# Patient Record
Sex: Male | Born: 1965 | Race: White | Hispanic: No | Marital: Single | Smoking: Former smoker
Health system: Southern US, Community
[De-identification: ages and names within clinical notes are randomized; demographics above are authoritative.]

## PROBLEM LIST (undated history)

## (undated) DIAGNOSIS — Z21 Asymptomatic human immunodeficiency virus [HIV] infection status: Secondary | ICD-10-CM

## (undated) DIAGNOSIS — F329 Major depressive disorder, single episode, unspecified: Secondary | ICD-10-CM

## (undated) DIAGNOSIS — N50819 Testicular pain, unspecified: Secondary | ICD-10-CM

## (undated) DIAGNOSIS — I509 Heart failure, unspecified: Secondary | ICD-10-CM

## (undated) DIAGNOSIS — F319 Bipolar disorder, unspecified: Secondary | ICD-10-CM

## (undated) DIAGNOSIS — F32A Depression, unspecified: Secondary | ICD-10-CM

## (undated) DIAGNOSIS — F419 Anxiety disorder, unspecified: Secondary | ICD-10-CM

## (undated) DIAGNOSIS — R197 Diarrhea, unspecified: Secondary | ICD-10-CM

## (undated) DIAGNOSIS — K219 Gastro-esophageal reflux disease without esophagitis: Secondary | ICD-10-CM

## (undated) DIAGNOSIS — B20 Human immunodeficiency virus [HIV] disease: Secondary | ICD-10-CM

## (undated) HISTORY — PX: CHOLECYSTECTOMY: SHX55

## (undated) HISTORY — PX: REPAIR / SUTURE TESTICULAR INJURY: SUR1151

## (undated) HISTORY — PX: TONSILLECTOMY: SUR1361

## (undated) HISTORY — PX: SURGERY SCROTAL / TESTICULAR: SUR1316

## (undated) HISTORY — PX: OTHER SURGICAL HISTORY: SHX169

## (undated) HISTORY — PX: SHOULDER SURGERY: SHX246

---

## 2009-12-20 DIAGNOSIS — Z21 Asymptomatic human immunodeficiency virus [HIV] infection status: Secondary | ICD-10-CM | POA: Insufficient documentation

## 2009-12-20 DIAGNOSIS — Z8619 Personal history of other infectious and parasitic diseases: Secondary | ICD-10-CM

## 2011-04-30 DIAGNOSIS — I219 Acute myocardial infarction, unspecified: Secondary | ICD-10-CM | POA: Insufficient documentation

## 2012-02-11 DIAGNOSIS — E663 Overweight: Secondary | ICD-10-CM | POA: Insufficient documentation

## 2012-06-11 DIAGNOSIS — G4700 Insomnia, unspecified: Secondary | ICD-10-CM | POA: Insufficient documentation

## 2012-06-11 DIAGNOSIS — F5104 Psychophysiologic insomnia: Secondary | ICD-10-CM | POA: Insufficient documentation

## 2013-02-18 DIAGNOSIS — N509 Disorder of male genital organs, unspecified: Secondary | ICD-10-CM | POA: Insufficient documentation

## 2013-08-25 DIAGNOSIS — N489 Disorder of penis, unspecified: Secondary | ICD-10-CM | POA: Insufficient documentation

## 2014-03-31 DIAGNOSIS — N50819 Testicular pain, unspecified: Secondary | ICD-10-CM | POA: Insufficient documentation

## 2014-04-08 DIAGNOSIS — L4050 Arthropathic psoriasis, unspecified: Secondary | ICD-10-CM | POA: Insufficient documentation

## 2014-04-08 DIAGNOSIS — H6980 Other specified disorders of Eustachian tube, unspecified ear: Secondary | ICD-10-CM | POA: Insufficient documentation

## 2014-04-08 DIAGNOSIS — Z79899 Other long term (current) drug therapy: Secondary | ICD-10-CM | POA: Insufficient documentation

## 2014-04-08 DIAGNOSIS — H9192 Unspecified hearing loss, left ear: Secondary | ICD-10-CM | POA: Insufficient documentation

## 2014-04-08 DIAGNOSIS — B20 Human immunodeficiency virus [HIV] disease: Secondary | ICD-10-CM | POA: Insufficient documentation

## 2014-04-26 DIAGNOSIS — G8929 Other chronic pain: Secondary | ICD-10-CM | POA: Insufficient documentation

## 2014-06-11 DIAGNOSIS — F4310 Post-traumatic stress disorder, unspecified: Secondary | ICD-10-CM | POA: Insufficient documentation

## 2014-07-29 DIAGNOSIS — Z87891 Personal history of nicotine dependence: Secondary | ICD-10-CM | POA: Insufficient documentation

## 2014-07-29 DIAGNOSIS — L409 Psoriasis, unspecified: Secondary | ICD-10-CM | POA: Insufficient documentation

## 2014-08-04 ENCOUNTER — Ambulatory Visit: Payer: Self-pay | Admitting: Rheumatology

## 2014-08-09 DIAGNOSIS — M7540 Impingement syndrome of unspecified shoulder: Secondary | ICD-10-CM | POA: Insufficient documentation

## 2014-09-03 ENCOUNTER — Ambulatory Visit: Payer: Self-pay | Admitting: Unknown Physician Specialty

## 2014-09-09 DIAGNOSIS — M7520 Bicipital tendinitis, unspecified shoulder: Secondary | ICD-10-CM | POA: Insufficient documentation

## 2014-10-14 DIAGNOSIS — M25569 Pain in unspecified knee: Secondary | ICD-10-CM | POA: Insufficient documentation

## 2014-11-15 ENCOUNTER — Emergency Department: Payer: Medicare Other

## 2014-11-15 ENCOUNTER — Encounter: Payer: Self-pay | Admitting: Emergency Medicine

## 2014-11-15 ENCOUNTER — Emergency Department
Admission: EM | Admit: 2014-11-15 | Discharge: 2014-11-15 | Disposition: A | Discharge status: Home / Self Care | Payer: Medicare Other | Attending: Emergency Medicine | Admitting: Emergency Medicine

## 2014-11-15 DIAGNOSIS — Z87891 Personal history of nicotine dependence: Secondary | ICD-10-CM | POA: Diagnosis not present

## 2014-11-15 DIAGNOSIS — G8929 Other chronic pain: Secondary | ICD-10-CM | POA: Insufficient documentation

## 2014-11-15 DIAGNOSIS — N508 Other specified disorders of male genital organs: Secondary | ICD-10-CM | POA: Diagnosis present

## 2014-11-15 DIAGNOSIS — N50819 Testicular pain, unspecified: Secondary | ICD-10-CM

## 2014-11-15 HISTORY — DX: Human immunodeficiency virus (HIV) disease: B20

## 2014-11-15 HISTORY — DX: Depression, unspecified: F32.A

## 2014-11-15 HISTORY — DX: Major depressive disorder, single episode, unspecified: F32.9

## 2014-11-15 HISTORY — DX: Anxiety disorder, unspecified: F41.9

## 2014-11-15 HISTORY — DX: Asymptomatic human immunodeficiency virus (hiv) infection status: Z21

## 2014-11-15 HISTORY — DX: Bipolar disorder, unspecified: F31.9

## 2014-11-15 LAB — URINALYSIS COMPLETE WITH MICROSCOPIC (ARMC ONLY)
BACTERIA UA: NONE SEEN
Bilirubin Urine: NEGATIVE
GLUCOSE, UA: NEGATIVE mg/dL
Hgb urine dipstick: NEGATIVE
KETONES UR: NEGATIVE mg/dL
LEUKOCYTES UA: NEGATIVE
NITRITE: NEGATIVE
Protein, ur: 30 mg/dL — AB
SPECIFIC GRAVITY, URINE: 1.023 (ref 1.005–1.030)
Squamous Epithelial / LPF: NONE SEEN
pH: 6 (ref 5.0–8.0)

## 2014-11-15 MED ORDER — OXYCODONE-ACETAMINOPHEN 5-325 MG PO TABS
1.0000 | ORAL_TABLET | Freq: Once | ORAL | Status: AC
  Administered 2014-11-15: 1 via ORAL

## 2014-11-15 MED ORDER — OXYCODONE-ACETAMINOPHEN 5-325 MG PO TABS: 1.0000 | ORAL_TABLET | ORAL | Status: AC | PRN

## 2014-11-15 MED ORDER — MORPHINE SULFATE 2 MG/ML IJ SOLN
INTRAMUSCULAR | Status: AC
  Administered 2014-11-15: 4 mg via INTRAVENOUS
  Filled 2014-11-15: qty 2

## 2014-11-15 MED ORDER — ONDANSETRON HCL 4 MG/2ML IJ SOLN
4.0000 mg | Freq: Once | INTRAMUSCULAR | Status: AC
  Administered 2014-11-15: 4 mg via INTRAVENOUS

## 2014-11-15 MED ORDER — ONDANSETRON HCL 4 MG/2ML IJ SOLN
INTRAMUSCULAR | Status: AC
  Administered 2014-11-15: 4 mg via INTRAVENOUS
  Filled 2014-11-15: qty 2

## 2014-11-15 MED ORDER — MORPHINE SULFATE 4 MG/ML IJ SOLN: 4.0000 mg | Freq: Once | INTRAMUSCULAR | Status: DC

## 2014-11-15 MED ORDER — KETOROLAC TROMETHAMINE 30 MG/ML IJ SOLN
INTRAMUSCULAR | Status: AC
  Filled 2014-11-15: qty 1

## 2014-11-15 MED ORDER — KETOROLAC TROMETHAMINE 30 MG/ML IJ SOLN
30.0000 mg | Freq: Once | INTRAMUSCULAR | Status: AC
  Administered 2014-11-15: 30 mg via INTRAVENOUS

## 2014-11-15 MED ORDER — OXYCODONE-ACETAMINOPHEN 5-325 MG PO TABS
ORAL_TABLET | ORAL | Status: AC
  Filled 2014-11-15: qty 1

## 2014-11-15 NOTE — Discharge Instructions (Signed)
Follow with Dr. Edwyna ShellHart or Dr. Apolinar JunesBrandon or with use a [facility] for further [facility] and ongoing care. Continue current medications. He may take some Percocet if needed for acute pain. Return to the emergency department if you have other urgent concerns.  Pain of Unknown Etiology (Pain Without a Known Cause) You have come to your caregiver because of pain. Pain can occur in any part of the body. Often there is not a definite cause. If your laboratory (blood or urine) work was normal and X-rays or other studies were normal, your caregiver may treat you without knowing the cause of the pain. An example of this is the headache. Most headaches are diagnosed by taking a history. This means your caregiver asks you questions about your headaches. Your caregiver determines a treatment based on your answers. Usually testing done for headaches is normal. Often testing is not done unless there is no response to medications. Regardless of where your pain is located today, you can be given medications to make you comfortable. If no physical cause of pain can be found, most cases of pain will gradually leave as suddenly as they came.  If you have a painful condition and no reason can be found for the pain, it is important that you follow up with your caregiver. If the pain becomes worse or does not go away, it may be necessary to repeat tests and look further for a possible cause.  Only take over-the-counter or prescription medicines for pain, discomfort, or fever as directed by your caregiver.  For the protection of your privacy, test results cannot be given over the phone. Make sure you receive the results of your test. Ask how these results are to be obtained if you have not been informed. It is your responsibility to obtain your test results.  You may continue all activities unless the activities cause more pain. When the pain lessens, it is important to gradually resume normal activities. Resume  activities by beginning slowly and gradually increasing the intensity and duration of the activities or exercise. During periods of severe pain, bed rest may be helpful. Lie or sit in any position that is comfortable.  Ice used for acute (sudden) conditions may be effective. Use a large plastic bag filled with ice and wrapped in a towel. This may provide pain relief.  See your caregiver for continued problems. Your caregiver can help or refer you for exercises or physical therapy if necessary. If you were given medications for your condition, do not drive, operate machinery or power tools, or sign legal documents for 24 hours. Do not drink alcohol, take sleeping pills, or take other medications that may interfere with treatment. See your caregiver immediately if you have pain that is becoming worse and not relieved by medications. Document Released: 03/06/2001 Document Revised: 04/01/2013 Document Reviewed: 06/11/2005 [facility] Patient Information 2015 [place], [place]. This information is not intended to replace advice given to you by your health care provider. Make sure you discuss any questions you have with your health care provider.

## 2014-11-15 NOTE — ED Notes (Signed)
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 [facility]. States does not know reason for pain.

## 2014-11-15 NOTE — ED Provider Notes (Signed)
[facility] Emergency Department Provider Note  ____________________________________________  Time seen: 1810  I have reviewed the triage vital signs and the nursing notes.   HISTORY  Chief Complaint Testicle Pain  right-sided    HPI Dennis Thompson is a 49 y.o. male who has had a problem with right testicular pain for approximately 3 years. He has seen different urologists, primarily at [facility]. He has not been able to get much relief. For a while Toradol by mouth and Viagra were helpful. Those medications no longer work well for him. Today the pain got acutely worse. He does report some nausea and vomiting with the pain. No diarrhea. The pain is in the right testicle and extends a little bit up into the inguinal canal. He has no abdominal pain itself. The pain is severe today.  He has had multiple various therapies in the past including nerve blocks and spinal injections which did not help much.  His primary physician is admitted in areas he currently does not have urologist.   Past Medical History  Diagnosis Date  . Bipolar 1 disorder   . HIV (human immunodeficiency virus infection)   . Depression   . Anxiety     There are no active problems to display for this patient.   Past Surgical History  Procedure Laterality Date  . Shoulder surgery Right     No current outpatient prescriptions on file.  Allergies Sulfa antibiotics  No family history on file.  Social History History  Substance Use Topics  . Smoking status: Former Games developer  . Smokeless tobacco: Not on file  . Alcohol Use: No    Review of Systems  Constitutional: Negative for fever. ENT: Negative for sore throat. Cardiovascular: Negative for chest pain. Respiratory: Negative for shortness of breath. Gastrointestinal: Negative for abdominal pain, vomiting and diarrhea. Genitourinary: Positive for chronic testicular pain. See history of present illness Musculoskeletal:  Negative for back pain. Skin: Negative for rash. Neurological: Negative for headaches Psychiatric: History of bipolar disorder. No acute issues currently.  10-point ROS otherwise negative.  ____________________________________________   PHYSICAL EXAM:  VITAL SIGNS: ED Triage Vitals  Enc Vitals Group     BP 11/15/14 1506 137/79 mmHg     Pulse Rate 11/15/14 1506 50     Resp 11/15/14 1506 18     Temp 11/15/14 1506 98.2 F (36.8 C)     Temp Source 11/15/14 1506 Oral     SpO2 11/15/14 1506 100 %     Weight 11/15/14 1506 177 lb (80.287 kg)     Height 11/15/14 1506  (1.676 m)     Head Cir --      Peak Flow --      Pain Score 11/15/14 1508 9     Pain Loc --      Pain Edu? --      Excl. in GC? --     Constitutional: Alert and oriented. Patient appears uncomfortable.Marland Kitchen ENT   Head: Normocephalic and atraumatic. Cardiovascular: Normal rate, regular rhythm. Respiratory: Normal respiratory effort without tachypnea. Breath sounds are clear and equal bilaterally. No wheezes/rales/rhonchi. Gastrointestinal: Soft and nontender. No distention.  Back: No muscle spasm, no tenderness, no CVA tenderness. Musculoskeletal: Nontender with normal range of motion in all extremities.  No noted edema. Neurologic:  Normal speech and language. No gross focal neurologic deficits are appreciated.  Skin:  Skin is warm, dry. No rash noted. Psychiatric: Mood and affect are normal. Speech and behavior are normal.  Genital:  Normal male genitalia. There is some tenderness to the right testicle. ____________________________________________    LABS (pertinent positives/negatives)  UA:  Urinalysis is negative. No white blood cells no red blood cells. ____________________________________________    RADIOLOGY  Testicular ultrasound  IMPRESSION: Small left hydrocele with multiple left epididymal cysts. No abnormalities on the right.Some of the images showing epididymis head cysts are mislabeled  "right" but I verified with Bridgette HabermannAshleigh Desena, the performing technologist, that all of these images apply to the left side, while the right epididymis is normal.  _________________________________________________   INITIAL IMPRESSION / ASSESSMENT AND PLAN / ED COURSE  Acute on chronic pain with unknown cause. This patient has pretty good insight to his situation. He is pleasant and communicative. He understands that with this chronic and calm. A problem we will not be solving her tonight. We had a long discussion about follow-up. We will refer him to Dr. Edwyna ShellHart and Dr. Apolinar JunesBrandon for further evaluation. I've also encouraged [facility] to [facility] as he has seen them before.  ____________________________________________   FINAL CLINICAL IMPRESSION(S) / ED DIAGNOSES  Final diagnoses:  Testicular pain      Darien Ramusavid W Chrys Landgrebe, MD 11/15/14 [phone]

## 2014-11-23 ENCOUNTER — Emergency Department: Payer: Medicare Other

## 2014-11-23 ENCOUNTER — Emergency Department
Admission: EM | Admit: 2014-11-23 | Discharge: 2014-11-23 | Disposition: A | Discharge status: Home / Self Care | Payer: Medicare Other | Attending: Emergency Medicine | Admitting: Emergency Medicine

## 2014-11-23 DIAGNOSIS — Z79899 Other long term (current) drug therapy: Secondary | ICD-10-CM | POA: Diagnosis not present

## 2014-11-23 DIAGNOSIS — R55 Syncope and collapse: Secondary | ICD-10-CM | POA: Diagnosis present

## 2014-11-23 DIAGNOSIS — Z87891 Personal history of nicotine dependence: Secondary | ICD-10-CM | POA: Insufficient documentation

## 2014-11-23 DIAGNOSIS — Y998 Other external cause status: Secondary | ICD-10-CM | POA: Diagnosis not present

## 2014-11-23 DIAGNOSIS — R197 Diarrhea, unspecified: Secondary | ICD-10-CM | POA: Insufficient documentation

## 2014-11-23 DIAGNOSIS — S01111A Laceration without foreign body of right eyelid and periocular area, initial encounter: Secondary | ICD-10-CM | POA: Insufficient documentation

## 2014-11-23 DIAGNOSIS — Y9389 Activity, other specified: Secondary | ICD-10-CM | POA: Insufficient documentation

## 2014-11-23 DIAGNOSIS — Y9289 Other specified places as the place of occurrence of the external cause: Secondary | ICD-10-CM | POA: Diagnosis not present

## 2014-11-23 DIAGNOSIS — S0083XA Contusion of other part of head, initial encounter: Secondary | ICD-10-CM

## 2014-11-23 DIAGNOSIS — W228XXA Striking against or struck by other objects, initial encounter: Secondary | ICD-10-CM | POA: Diagnosis not present

## 2014-11-23 LAB — CBC
HCT: 52 % (ref 40.0–52.0)
Hemoglobin: 17.7 g/dL (ref 13.0–18.0)
MCH: 33.6 pg (ref 26.0–34.0)
MCHC: 34.1 g/dL (ref 32.0–36.0)
MCV: 98.6 fL (ref 80.0–100.0)
PLATELETS: 265 10*3/uL (ref 150–440)
RBC: 5.28 MIL/uL (ref 4.40–5.90)
RDW: 13.3 % (ref 11.5–14.5)
WBC: 12.6 10*3/uL — ABNORMAL HIGH (ref 3.8–10.6)

## 2014-11-23 LAB — BASIC METABOLIC PANEL
Anion gap: 11 (ref 5–15)
BUN: 11 mg/dL (ref 6–20)
CALCIUM: 10.5 mg/dL — AB (ref 8.9–10.3)
CO2: 22 mmol/L (ref 22–32)
CREATININE: 1.28 mg/dL — AB (ref 0.61–1.24)
Chloride: 104 mmol/L (ref 101–111)
GFR calc Af Amer: >60 mL/min (ref >60)
Glucose, Bld: 97 mg/dL (ref 65–99)
Potassium: 3.4 mmol/L — ABNORMAL LOW (ref 3.5–5.1)
SODIUM: 137 mmol/L (ref 135–145)

## 2014-11-23 LAB — C DIFFICILE QUICK SCREEN W PCR REFLEX
C DIFFICILE (CDIFF) TOXIN: NEGATIVE
C DIFFICLE (CDIFF) ANTIGEN: NEGATIVE
C Diff interpretation: NEGATIVE

## 2014-11-23 MED ORDER — ONDANSETRON HCL 4 MG/2ML IJ SOLN
4.0000 mg | Freq: Once | INTRAMUSCULAR | Status: AC
  Administered 2014-11-23: 4 mg via INTRAVENOUS

## 2014-11-23 MED ORDER — SODIUM CHLORIDE 0.9 % IV BOLUS (SEPSIS)
1000.0000 mL | Freq: Once | INTRAVENOUS | Status: AC
  Administered 2014-11-23: 1000 mL via INTRAVENOUS

## 2014-11-23 MED ORDER — KETOROLAC TROMETHAMINE 30 MG/ML IJ SOLN
30.0000 mg | Freq: Once | INTRAMUSCULAR | Status: AC
  Administered 2014-11-23: 30 mg via INTRAVENOUS

## 2014-11-23 MED ORDER — KETOROLAC TROMETHAMINE 30 MG/ML IJ SOLN
INTRAMUSCULAR | Status: AC
  Administered 2014-11-23: 30 mg via INTRAVENOUS
  Filled 2014-11-23: qty 1

## 2014-11-23 MED ORDER — OXYCODONE HCL 5 MG PO TABS
ORAL_TABLET | ORAL | Status: AC
  Administered 2014-11-23: 10 mg via ORAL
  Filled 2014-11-23: qty 2

## 2014-11-23 MED ORDER — OXYCODONE HCL 5 MG PO TABS
10.0000 mg | ORAL_TABLET | Freq: Once | ORAL | Status: AC
Start: 2014-11-23 — End: 2014-11-23
  Administered 2014-11-23: 10 mg via ORAL

## 2014-11-23 MED ORDER — ONDANSETRON HCL 4 MG/2ML IJ SOLN
INTRAMUSCULAR | Status: AC
  Filled 2014-11-23: qty 2

## 2014-11-23 MED ORDER — METOCLOPRAMIDE HCL 5 MG/ML IJ SOLN
5.0000 mg | Freq: Once | INTRAMUSCULAR | Status: AC
  Administered 2014-11-23: 5 mg via INTRAVENOUS

## 2014-11-23 MED ORDER — SODIUM CHLORIDE 0.9 % IV SOLN
Freq: Once | INTRAVENOUS | Status: AC
  Administered 2014-11-23: 21:00:00 via INTRAVENOUS

## 2014-11-23 MED ORDER — METOCLOPRAMIDE HCL 5 MG/ML IJ SOLN
INTRAMUSCULAR | Status: AC
  Administered 2014-11-23: 5 mg via INTRAVENOUS
  Filled 2014-11-23: qty 2

## 2014-11-23 MED ORDER — METRONIDAZOLE 500 MG PO TABS: 500.0000 mg | ORAL_TABLET | Freq: Two times a day (BID) | ORAL | Status: AC

## 2014-11-23 MED ORDER — METOCLOPRAMIDE HCL 5 MG PO TABS: 5.0000 mg | ORAL_TABLET | Freq: Three times a day (TID) | ORAL | Status: AC

## 2014-11-23 NOTE — ED Provider Notes (Signed)
[facility] Emergency Department Provider Note  ____________________________________________  Time seen: 1805  I have reviewed the triage vital signs and the nursing notes.   HISTORY  Chief Complaint Loss of Consciousness and Diarrhea     HPI Dennis Thompson is a 49 y.o. male known to me from prior visits. He has a long history of chronic testicular pain. He is also HIV positive on antiretrovirals. Today he comes to the emergency Department due to nausea vomiting and diarrhea. He has had an extensive amount of watery diarrhea over the past 3 days.   Last night, while on the toilet, he began to feel weak and faint and had a syncopal episode. He woke up on the floor with a contusion to his right brow. He is not sure how long he was unconscious for.  He presents today with ongoing nausea and vomiting. He reports 4-5 bowel movements earlier today, all were watery. He has not been on any antibiotics recently, he does not have a history of Clostridium difficile.   Past Medical History  Diagnosis Date  . Bipolar 1 disorder   . HIV (human immunodeficiency virus infection)   . Depression   . Anxiety     There are no active problems to display for this patient.   Past Surgical History  Procedure Laterality Date  . Shoulder surgery Right   . Cholecystectomy    . Tonsillectomy    . Repair / suture testicular injury Right     Current Outpatient Rx  Name  Route  Sig  Dispense  Refill  . Adalimumab 40 MG/0.8ML PSKT   Subcutaneous   Inject 40 mg into the skin every 14 (fourteen) days.         Marland Kitchen. buPROPion (WELLBUTRIN XL) 150 MG 24 hr tablet   Oral   Take 150 mg by mouth daily.         . clonazePAM (KLONOPIN) 0.5 MG tablet   Oral   Take 0.5 mg by mouth 3 (three) times daily as needed for anxiety.         Marland Kitchen. emtricitabine-tenofovir (TRUVADA) 200-300 MG per tablet   Oral   Take 1 tablet by mouth daily.         Marland Kitchen. esomeprazole (NEXIUM) 40 MG capsule    Oral   Take 40 mg by mouth daily at 12 noon.         . folic acid (FOLVITE) 1 MG tablet   Oral   Take 1 mg by mouth daily.         Marland Kitchen. Ketorolac Tromethamine (TORADOL ORAL PO)   Oral   Take 25 mg by mouth daily. Pt states takes 25 mg of toradol for testicular pain         . lithium 300 MG tablet   Oral   Take 900 mg by mouth 2 (two) times daily.         . methotrexate (RHEUMATREX) 2.5 MG tablet   Oral   Take 2.5 mg by mouth once a week. Caution:Chemotherapy. Protect from light.         . raltegravir (ISENTRESS) 400 MG tablet   Oral   Take 400 mg by mouth 2 (two) times daily.         . sildenafil (REVATIO) 20 MG tablet   Oral   Take 10 mg by mouth daily as needed (used for testicle pain according to pt).         . tamsulosin (FLOMAX) 0.4  MG CAPS capsule   Oral   Take 0.4 mg by mouth.         . valACYclovir (VALTREX) 500 MG tablet   Oral   Take 500 mg by mouth 2 (two) times daily.         . metoCLOPramide (REGLAN) 5 MG tablet   Oral   Take 1 tablet (5 mg total) by mouth 3 (three) times daily.   15 tablet   1   . metroNIDAZOLE (FLAGYL) 500 MG tablet   Oral   Take 1 tablet (500 mg total) by mouth 2 (two) times daily.   14 tablet   0   . oxyCODONE-acetaminophen (ROXICET) 5-325 MG per tablet   Oral   Take 1 tablet by mouth every 4 (four) hours as needed for severe pain.   12 tablet   0     Allergies Sulfa antibiotics  No family history on file.  Social History History  Substance Use Topics  . Smoking status: Former Games developer  . Smokeless tobacco: Never Used  . Alcohol Use: No    Review of Systems  Constitutional: Negative for fever. ENT:  Contusion right brow, see history of present illness. Cardiovascular: Negative for chest pain. Notable for syncope, see history of present illness Respiratory: Negative for shortness of breath. Gastrointestinal: Nausea vomiting diarrhea. See history of present illness. Genitourinary: Negative for  dysuria. Musculoskeletal: Negative for back pain. Skin: Negative for rash. Neurological: Positive for headache, likely from contusion. See history of present illness Immunologic: HIV, on antiretrovirals.  10-point ROS otherwise negative.  ____________________________________________   PHYSICAL EXAM:  VITAL SIGNS: ED Triage Vitals  Enc Vitals Group     BP 11/23/14 1603 133/92 mmHg     Pulse Rate 11/23/14 1603 56     Resp 11/23/14 1603 16     Temp 11/23/14 1603 97.5 F (36.4 C)     Temp Source 11/23/14 1603 Oral     SpO2 11/23/14 1603 99 %     Weight 11/23/14 1603 171 lb 3.2 oz (77.656 kg)     Height 11/23/14 1603  (1.676 m)     Head Cir --      Peak Flow --      Pain Score 11/23/14 1604 7     Pain Loc --      Pain Edu? --      Excl. in GC? --     Constitutional:  Alert and oriented. Patient appears frustrated by his wait to be seen in the emergency department and by his current location and a whole bed.  ENT   Head: There is a contusion to the right brow with a small laceration. There is minimal swelling there. The remainder of the exam of his head is within normal expectations.   Nose: No congestion/rhinnorhea.   Mouth/Throat: Mucous membranes are moist. Cardiovascular: Normal rate, regular rhythm. Respiratory: Normal respiratory effort without tachypnea. Breath sounds are clear and equal bilaterally. No wheezes/rales/rhonchi. Gastrointestinal: Soft with mild tenderness. Back: No muscle spasm, no tenderness, no CVA tenderness. Musculoskeletal: Nontender with normal range of motion in all extremities.  No noted edema. Neurologic:  Normal speech and language. No gross focal neurologic deficits are appreciated.  Skin:  Skin is warm, dry. No rash noted. Psychiatric: Other than the patient's frustration with the emergency department wait and location, his mood and affect are normal. Given our family Artie with each other, he calm down quickly and is appreciative  of the care. Speech and behavior  are normal.  ____________________________________________    LABS (pertinent positives/negatives)  Blood cell count 12.6 thousand and hemoglobin of 17.7 Potassium 3.4U and 11 creatinine 1.28 sodium 137.  ____________________________________________  ED ECG REPORT I, Uriyah Raska W, the attending physician, personally viewed and interpreted this ECG.   Date: 11/23/2014  EKG Time: 1631  Rate: 52  Rhythm:Sinus bradycardia  Axis: Normal  Intervals:  Normal  ST&T Change:  None  RADIOLOGY  CT head:  IMPRESSION: Mild ethmoid air cell disease bilaterally. No intracranial mass, hemorrhage, or extra-axial fluid collection. No focal gray -white compartment lesion/acute appearing infarct  ____________________________________________   INITIAL IMPRESSION / ASSESSMENT AND PLAN / ED COURSE  I'm concerned for the patient with his watery diarrhea that he may have C. difficile or some other form of infection. We will start him on metronidazole. We have obtained a small sample for C. difficile testing. He felt much better after some IV fluids and Reglan IV. He was treated with Toradol for his headache.   ----------------------------------------- 10:25 PM on 11/23/2014 -----------------------------------------  After 2 L of IV fluid the patient has urinated once. He is tolerating food and fluids by mouth. He continues to complain of a general headache as well as some abdominal discomfort, but he is not having any current nausea. He has an appointment tomorrow with his infectious disease doctor at [facility]. I will prescribe metronidazole and Reglan.  ____________________________________________   FINAL CLINICAL IMPRESSION(S) / ED DIAGNOSES  Final diagnoses:  Diarrhea  Vasovagal syncope  Contusion of face, initial encounter      Darien Ramus, MD 11/23/14 2226

## 2014-11-23 NOTE — ED Notes (Signed)
Pt c/o having watery diarrhea since Sunday with nausea..states last night around 10pm while on the toilet having a loose stool he states he passed out hitting head, none bleeding lac to the right brow.the patient  C/o HA with nausea since..Marland Kitchen

## 2014-11-23 NOTE — Discharge Instructions (Signed)
It is not clear what is causing her diarrhea currently. We are testing for C. difficile. Take metronidazole. Use Reglan if needed for nausea. Follow-up with your regular doctor. Return to the emergency department if you have any other urgent concerns.  Diarrhea Diarrhea is frequent loose and watery bowel movements. It can cause you to feel weak and dehydrated. Dehydration can cause you to become tired and thirsty, have a dry mouth, and have decreased urination that often is dark yellow. Diarrhea is a sign of another problem, most often an infection that will not last long. In most cases, diarrhea typically lasts 2-3 days. However, it can last longer if it is a sign of something more serious. It is important to treat your diarrhea as directed by your caregiver to lessen or prevent future episodes of diarrhea. CAUSES  Some common causes include:  Gastrointestinal infections caused by viruses, bacteria, or parasites.  Food poisoning or food allergies.  Certain medicines, such as antibiotics, chemotherapy, and laxatives.  Artificial sweeteners and fructose.  Digestive disorders. HOME CARE INSTRUCTIONS  Ensure adequate fluid intake (hydration): Have 1 cup (8 oz) of fluid for each diarrhea episode. Avoid fluids that contain simple sugars or sports drinks, fruit juices, whole milk products, and sodas. Your urine should be clear or pale yellow if you are drinking enough fluids. Hydrate with an oral rehydration solution that you can purchase at pharmacies, retail stores, and online. You can prepare an oral rehydration solution at home by mixing the following ingredients together:   - tsp table salt.   tsp baking soda.   tsp salt substitute containing potassium chloride.  1  tablespoons sugar.  1 L (34 oz) of water.  Certain foods and beverages may increase the speed at which food moves through the gastrointestinal (GI) tract. These foods and beverages should be avoided and  include:  Caffeinated and alcoholic beverages.  High-fiber foods, such as raw fruits and vegetables, nuts, seeds, and whole grain breads and cereals.  Foods and beverages sweetened with sugar alcohols, such as xylitol, sorbitol, and mannitol.  Some foods may be well tolerated and may help thicken stool including:  Starchy foods, such as rice, toast, pasta, low-sugar cereal, oatmeal, grits, baked potatoes, crackers, and bagels.  Bananas.  Applesauce.  Add probiotic-rich foods to help increase healthy bacteria in the GI tract, such as yogurt and fermented milk products.  Wash your hands well after each diarrhea episode.  Only take over-the-counter or prescription medicines as directed by your caregiver.  Take a warm bath to relieve any burning or pain from frequent diarrhea episodes. SEEK IMMEDIATE MEDICAL CARE IF:   You are unable to keep fluids down.  You have persistent vomiting.  You have blood in your stool, or your stools are black and tarry.  You do not urinate in 6-8 hours, or there is only a small amount of very dark urine.  You have abdominal pain that increases or localizes.  You have weakness, dizziness, confusion, or light-headedness.  You have a severe headache.  Your diarrhea gets worse or does not get better.  You have a fever or persistent symptoms for more than 2-3 days.  You have a fever and your symptoms suddenly get worse. MAKE SURE YOU:   Understand these instructions.  Will watch your condition.  Will get help right away if you are not doing well or get worse. Document Released: 06/01/2002 Document Revised: 10/26/2013 Document Reviewed: 02/17/2012 [facility] Patient Information 2015 [place], [place]. This information is not  intended to replace advice given to you by your health care provider. Make sure you discuss any questions you have with your health care provider. ° °

## 2014-11-23 NOTE — ED Notes (Signed)
MD at bedside. 

## 2014-12-06 ENCOUNTER — Telehealth: Payer: Self-pay | Admitting: Urology

## 2014-12-06 NOTE — Telephone Encounter (Addendum)
Pt called on 12/06/14 to confirm appt on 12/10/14  to find out that it was scheduled with Dr. Erlene Quan instead of Dr. Elnoria Howard. Pt was upset that the provider had been changed w/o his knowledge. That the ER doctor who referred him had wanted him to see Dr. Elnoria Howard and that he has been "fighting" with his current doctors office to have his medical records released to our office in time for the appointment on 12/10/14. Pt wants to speak with Lattie Haw when he comes in on 7.8.16 @ 4:30 pm. Best contact # [phone]  12/06/14 MAF

## 2014-12-10 ENCOUNTER — Ambulatory Visit: Payer: Self-pay | Admitting: Urology

## 2014-12-23 ENCOUNTER — Encounter: Payer: Self-pay | Admitting: Emergency Medicine

## 2014-12-23 ENCOUNTER — Emergency Department
Admission: EM | Admit: 2014-12-23 | Discharge: 2014-12-24 | Disposition: A | Discharge status: Home / Self Care | Payer: Medicare Other | Attending: Emergency Medicine | Admitting: Emergency Medicine

## 2014-12-23 DIAGNOSIS — Z79899 Other long term (current) drug therapy: Secondary | ICD-10-CM | POA: Diagnosis not present

## 2014-12-23 DIAGNOSIS — R197 Diarrhea, unspecified: Secondary | ICD-10-CM | POA: Diagnosis present

## 2014-12-23 DIAGNOSIS — R1084 Generalized abdominal pain: Secondary | ICD-10-CM | POA: Diagnosis not present

## 2014-12-23 DIAGNOSIS — Z87891 Personal history of nicotine dependence: Secondary | ICD-10-CM | POA: Diagnosis not present

## 2014-12-23 HISTORY — DX: Testicular pain, unspecified: N50.819

## 2014-12-23 HISTORY — DX: Diarrhea, unspecified: R19.7

## 2014-12-23 HISTORY — DX: Heart failure, unspecified: I50.9

## 2014-12-23 LAB — CBC WITH DIFFERENTIAL/PLATELET
Basophils Absolute: 0 10*3/uL (ref 0–0.1)
Basophils Relative: 1 %
EOS ABS: 0.5 10*3/uL (ref 0–0.7)
EOS PCT: 5 %
HEMATOCRIT: 45.7 % (ref 40.0–52.0)
Hemoglobin: 15.4 g/dL (ref 13.0–18.0)
LYMPHS PCT: 23 %
Lymphs Abs: 2.1 10*3/uL (ref 1.0–3.6)
MCH: 33.7 pg (ref 26.0–34.0)
MCHC: 33.8 g/dL (ref 32.0–36.0)
MCV: 99.9 fL (ref 80.0–100.0)
MONOS PCT: 10 %
Monocytes Absolute: 1 10*3/uL (ref 0.2–1.0)
Neutro Abs: 5.8 10*3/uL (ref 1.4–6.5)
Neutrophils Relative %: 61 %
PLATELETS: 231 10*3/uL (ref 150–440)
RBC: 4.57 MIL/uL (ref 4.40–5.90)
RDW: 13.2 % (ref 11.5–14.5)
WBC: 9.4 10*3/uL (ref 3.8–10.6)

## 2014-12-23 LAB — COMPREHENSIVE METABOLIC PANEL
ALT: 22 U/L (ref 17–63)
ANION GAP: 14 (ref 5–15)
AST: 36 U/L (ref 15–41)
Albumin: 4.2 g/dL (ref 3.5–5.0)
Alkaline Phosphatase: 116 U/L (ref 38–126)
BUN: 15 mg/dL (ref 6–20)
CALCIUM: 10.3 mg/dL (ref 8.9–10.3)
CHLORIDE: 104 mmol/L (ref 101–111)
CO2: 20 mmol/L — ABNORMAL LOW (ref 22–32)
Creatinine, Ser: 1.46 mg/dL — ABNORMAL HIGH (ref 0.61–1.24)
GFR calc Af Amer: >60 mL/min (ref >60)
GFR calc non Af Amer: 56 mL/min — ABNORMAL LOW (ref >60)
GLUCOSE: 125 mg/dL — AB (ref 65–99)
Potassium: 2.9 mmol/L — CL (ref 3.5–5.1)
SODIUM: 138 mmol/L (ref 135–145)
TOTAL PROTEIN: 7 g/dL (ref 6.5–8.1)
Total Bilirubin: 1.1 mg/dL (ref 0.3–1.2)

## 2014-12-23 LAB — URINALYSIS COMPLETE WITH MICROSCOPIC (ARMC ONLY)
BILIRUBIN URINE: NEGATIVE
Glucose, UA: NEGATIVE mg/dL
HGB URINE DIPSTICK: NEGATIVE
Ketones, ur: NEGATIVE mg/dL
LEUKOCYTES UA: NEGATIVE
Nitrite: NEGATIVE
PROTEIN: NEGATIVE mg/dL
SPECIFIC GRAVITY, URINE: 1.017 (ref 1.005–1.030)
pH: 6 (ref 5.0–8.0)

## 2014-12-23 LAB — LIPASE, BLOOD: LIPASE: 58 U/L — AB (ref 22–51)

## 2014-12-23 MED ORDER — POTASSIUM CHLORIDE 20 MEQ/15ML (10%) PO SOLN
40.0000 meq | Freq: Once | ORAL | Status: AC
  Administered 2014-12-24: 40 meq via ORAL

## 2014-12-23 MED ORDER — POTASSIUM CHLORIDE 20 MEQ PO PACK
PACK | ORAL | Status: AC
  Administered 2014-12-24: 40 meq
  Filled 2014-12-23: qty 2

## 2014-12-23 MED ORDER — SODIUM CHLORIDE 0.9 % IV BOLUS (SEPSIS)
1000.0000 mL | Freq: Once | INTRAVENOUS | Status: AC
  Administered 2014-12-24: 1000 mL via INTRAVENOUS

## 2014-12-23 NOTE — ED Notes (Signed)
Discussed need for urine sample with patient. Patient denies need to void at this time. Encouraged patient to notify nurse when able to provide sample. Patient verbalized understanding.   

## 2014-12-23 NOTE — ED Provider Notes (Signed)
[facility] Emergency Department Provider Note  ____________________________________________  Time seen: 11:20 PM  I have reviewed the triage vital signs and the nursing notes.   HISTORY  Chief Complaint Emesis and Diarrhea      HPI Dennis Thompson is a 49 y.o. male presents with generalized abdominal discomfort and nausea and persistent diarrhea times approximately 3 weeks. Patient admits to copious amounts of malodorous diarrhea with mucus daily 3 weeks. Patient admits to visiting [place] prior to onset of symptoms and drank water from a well that is was approximated to a creek. Patient also admits to chills no fever.    Past Medical History  Diagnosis Date  . Bipolar 1 disorder   . HIV (human immunodeficiency virus infection)   . Depression   . Anxiety   . Testicular pain   . Diarrhea     There are no active problems to display for this patient.   Past Surgical History  Procedure Laterality Date  . Shoulder surgery Right   . Cholecystectomy    . Tonsillectomy    . Repair / suture testicular injury Right     Current Outpatient Rx  Name  Route  Sig  Dispense  Refill  . Adalimumab 40 MG/0.8ML PSKT   Subcutaneous   Inject 40 mg into the skin every 14 (fourteen) days.         Marland Kitchen buPROPion (WELLBUTRIN XL) 150 MG 24 hr tablet   Oral   Take 150 mg by mouth daily.         . clonazePAM (KLONOPIN) 0.5 MG tablet   Oral   Take 0.5 mg by mouth 3 (three) times daily as needed for anxiety.         Marland Kitchen emtricitabine-tenofovir (TRUVADA) 200-300 MG per tablet   Oral   Take 1 tablet by mouth daily.         Marland Kitchen esomeprazole (NEXIUM) 40 MG capsule   Oral   Take 40 mg by mouth daily at 12 noon.         . folic acid (FOLVITE) 1 MG tablet   Oral   Take 1 mg by mouth daily.         Marland Kitchen Ketorolac Tromethamine (TORADOL ORAL PO)   Oral   Take 25 mg by mouth daily. Pt states takes 25 mg of toradol for testicular pain         . lithium 300 MG  tablet   Oral   Take 900 mg by mouth 2 (two) times daily.         . methotrexate (RHEUMATREX) 2.5 MG tablet   Oral   Take 2.5 mg by mouth once a week. Caution:Chemotherapy. Protect from light.         . metoCLOPramide (REGLAN) 5 MG tablet   Oral   Take 1 tablet (5 mg total) by mouth 3 (three) times daily.   15 tablet   1   . metroNIDAZOLE (FLAGYL) 500 MG tablet   Oral   Take 1 tablet (500 mg total) by mouth 2 (two) times daily.   14 tablet   0   . oxyCODONE-acetaminophen (ROXICET) 5-325 MG per tablet   Oral   Take 1 tablet by mouth every 4 (four) hours as needed for severe pain.   12 tablet   0   . raltegravir (ISENTRESS) 400 MG tablet   Oral   Take 400 mg by mouth 2 (two) times daily.         Marland Kitchen  sildenafil (REVATIO) 20 MG tablet   Oral   Take 10 mg by mouth daily as needed (used for testicle pain according to pt).         . tamsulosin (FLOMAX) 0.4 MG CAPS capsule   Oral   Take 0.4 mg by mouth.         . valACYclovir (VALTREX) 500 MG tablet   Oral   Take 500 mg by mouth 2 (two) times daily.           Allergies Sulfa antibiotics  No family history on file.  Social History History  Substance Use Topics  . Smoking status: Former Games developermoker  . Smokeless tobacco: Never Used  . Alcohol Use: No    Review of Systems  Constitutional: Negative for fever. Eyes: Negative for visual changes. ENT: Negative for sore throat. Cardiovascular: Negative for chest pain. Respiratory: Negative for shortness of breath. Gastrointestinal: Positive for abdominal pain, negative for vomiting and diarrhea. Genitourinary: Negative for dysuria. Musculoskeletal: Negative for back pain. Skin: Negative for rash. Neurological: Negative for headaches, focal weakness or numbness.   10-point ROS otherwise negative.  ____________________________________________   PHYSICAL EXAM:  VITAL SIGNS: ED Triage Vitals  Enc Vitals Group     BP 12/23/14 2114 138/78 mmHg     Pulse  Rate 12/23/14 2114 67     Resp 12/23/14 2114 16     Temp 12/23/14 2114 98.2 F (36.8 C)     Temp Source 12/23/14 2114 Oral     SpO2 12/23/14 2114 98 %     Weight 12/23/14 2114 164 lb 3.2 oz (74.481 kg)     Height 12/23/14 2114 5\' 6"  (1.676 m)     Head Cir --      Peak Flow --      Pain Score 12/23/14 2115 7     Pain Loc --      Pain Edu? --      Excl. in GC? --     Constitutional: Alert and oriented. Well appearing and in no distress. Eyes: Conjunctivae are normal. PERRL. Normal extraocular movements. ENT   Head: Normocephalic and atraumatic.   Nose: No congestion/rhinnorhea.   Mouth/Throat: Mucous membranes are moist.   Neck: No stridor. Cardiovascular: Normal rate, regular rhythm. Normal and symmetric distal pulses are present in all extremities. No murmurs, rubs, or gallops. Respiratory: Normal respiratory effort without tachypnea nor retractions. Breath sounds are clear and equal bilaterally. No wheezes/rales/rhonchi. Gastrointestinal: Generalized tenderness to palpation. No distention. There is no CVA tenderness. Genitourinary: deferred Musculoskeletal: Nontender with normal range of motion in all extremities. No joint effusions.  No lower extremity tenderness nor edema. Neurologic:  Normal speech and language. No gross focal neurologic deficits are appreciated. Speech is normal.  Skin:  Skin is warm, dry and intact. No rash noted. Psychiatric: Mood and affect are normal. Speech and behavior are normal. Patient exhibits appropriate insight and judgment.  ____________________________________________    LABS (pertinent positives/negatives)  Labs Reviewed  COMPREHENSIVE METABOLIC PANEL - Abnormal; Notable for the following:    Potassium 2.9 (*)    CO2 20 (*)    Glucose, Bld 125 (*)    Creatinine, Ser 1.46 (*)    GFR calc non Af Amer 56 (*)    All other components within normal limits  LIPASE, BLOOD - Abnormal; Notable for the following:    Lipase 58 (*)     All other components within normal limits  URINALYSIS COMPLETEWITH MICROSCOPIC ([facility] ONLY) - Abnormal; Notable for the  following:    Color, Urine YELLOW (*)    APPearance CLEAR (*)    Bacteria, UA RARE (*)    Squamous Epithelial / LPF 0-5 (*)    All other components within normal limits  STOOL CULTURE  GIARDIA/CRYPTOSPORIDIUM EIA  CBC WITH DIFFERENTIAL/PLATELET      RADIOLOGY  Ct abdomen/pelvis revealed:  IMPRESSION: Fluid-filled colon consistent with the given clinical history of diarrhea.  Small testicular hydroceles.  No other focal abnormality is noted.   INITIAL IMPRESSION / ASSESSMENT AND PLAN / ED COURSE  Pertinent labs & imaging results that were available during my care of the patient were reviewed by me and considered in my medical decision making (see chart for details).  History and physical exam concerning for infectious diarrhea. Patient prescribed ciprofloxacin and Flagyl while we await Giardia cryptosporidia and stool culture.  ____________________________________________   FINAL CLINICAL IMPRESSION(S) / ED DIAGNOSES  Final diagnoses:  Diarrhea      Darci Current, MD 12/28/14 [phone]

## 2014-12-23 NOTE — ED Notes (Signed)
N/V/D all day. Also experiencing testicular pain. Headache.

## 2014-12-24 ENCOUNTER — Emergency Department: Payer: Medicare Other

## 2014-12-24 MED ORDER — IOHEXOL 240 MG/ML SOLN
25.0000 mL | Freq: Once | INTRAMUSCULAR | Status: AC | PRN
  Administered 2014-12-24: 25 mL via ORAL

## 2014-12-24 MED ORDER — METRONIDAZOLE 500 MG PO TABS: 500.0000 mg | ORAL_TABLET | Freq: Once | ORAL | Status: AC

## 2014-12-24 MED ORDER — METRONIDAZOLE 500 MG PO TABS
ORAL_TABLET | ORAL | Status: AC
  Administered 2014-12-24: 500 mg via ORAL
  Filled 2014-12-24: qty 1

## 2014-12-24 MED ORDER — IOHEXOL 300 MG/ML  SOLN
100.0000 mL | Freq: Once | INTRAMUSCULAR | Status: AC | PRN
  Administered 2014-12-24: 100 mL via INTRAVENOUS

## 2014-12-24 MED ORDER — LORAZEPAM 2 MG/ML IJ SOLN
INTRAMUSCULAR | Status: AC
  Administered 2014-12-24: 1 mg via INTRAVENOUS
  Filled 2014-12-24: qty 1

## 2014-12-24 MED ORDER — LOPERAMIDE HCL 2 MG PO CAPS
4.0000 mg | ORAL_CAPSULE | ORAL | Status: DC | PRN
  Administered 2014-12-24: 4 mg via ORAL

## 2014-12-24 MED ORDER — LOPERAMIDE HCL 2 MG PO CAPS
ORAL_CAPSULE | ORAL | Status: AC
  Administered 2014-12-24: 4 mg via ORAL
  Filled 2014-12-24: qty 2

## 2014-12-24 MED ORDER — CIPROFLOXACIN HCL 500 MG PO TABS
ORAL_TABLET | ORAL | Status: AC
  Administered 2014-12-24: 500 mg via ORAL
  Filled 2014-12-24: qty 1

## 2014-12-24 MED ORDER — METRONIDAZOLE 500 MG PO TABS
500.0000 mg | ORAL_TABLET | Freq: Once | ORAL | Status: AC
  Administered 2014-12-24: 500 mg via ORAL

## 2014-12-24 MED ORDER — LORAZEPAM 2 MG/ML IJ SOLN
1.0000 mg | Freq: Once | INTRAMUSCULAR | Status: AC
  Administered 2014-12-24: 1 mg via INTRAVENOUS

## 2014-12-24 MED ORDER — CIPROFLOXACIN HCL 500 MG PO TABS: 500.0000 mg | ORAL_TABLET | Freq: Once | ORAL | Status: AC

## 2014-12-24 MED ORDER — CIPROFLOXACIN HCL 500 MG PO TABS
500.0000 mg | ORAL_TABLET | Freq: Once | ORAL | Status: AC
  Administered 2014-12-24: 500 mg via ORAL

## 2014-12-24 NOTE — Discharge Instructions (Signed)
Chronic Diarrhea  Diarrhea is frequent loose and watery bowel movements. It can cause you to feel weak and dehydrated. Dehydration can cause you to become tired and thirsty and to have a dry mouth, decreased urination, and dark yellow urine. Diarrhea is a sign of another problem, most often an infection that will not last long. In most cases, diarrhea lasts 2-3 days. Diarrhea that lasts longer than 4 weeks is called long-lasting (chronic) diarrhea. It is important to treat your diarrhea as directed by your health care provider to lessen or prevent future episodes of diarrhea.   CAUSES   There are many causes of chronic diarrhea. The following are some possible causes:   · Gastrointestinal infections caused by viruses, bacteria, or parasites.    · Food poisoning or food allergies.    · Certain medicines, such as antibiotics, chemotherapy, and laxatives.    · Artificial sweeteners and fructose.    · Digestive disorders, such as celiac disease and inflammatory bowel diseases.    · Irritable bowel syndrome.  · Some disorders of the pancreas.  · Disorders of the thyroid.  · Reduced blood flow to the intestines.  · Cancer.  Sometimes the cause of chronic diarrhea is unknown.  RISK FACTORS  · Having a severely weakened immune system, such as from HIV or AIDS.    · Taking certain types of cancer-fighting drugs (such as with chemotherapy) or other medicines.    · Having had a recent organ transplant.    · Having a portion of the stomach or small bowel removed.    · Traveling to countries where food and water supplies are often contaminated.    SYMPTOMS   In addition to frequent, loose stools, diarrhea may cause:   · Cramping.    · Abdominal pain.    · Nausea.    · Fever.  · Fatigue.  · Urgent need to use the bathroom.  · Loss of bowel control.  DIAGNOSIS   Your health care provider must take a careful history and perform a physical exam. Tests given are based on your symptoms and history. Tests may include:   · Blood or  stool tests. Three or more stool samples may be examined. Stool cultures may be used to test for bacteria or parasites.    · X-rays.    · A procedure in which a thin tube is inserted into the mouth or rectum (endoscopy). This allows the health care provider to look inside the intestine.    TREATMENT   · Treatment is aimed at correcting the cause of the diarrhea when possible.  · Diarrhea caused by an infection can often be treated with antibiotic medicines.  · Diarrhea not caused by an infection may require you to take long-term medicine or have surgery. Specific treatment should be discussed with your health care provider.  · If the cause cannot be determined, treatment aims to relieve symptoms and prevent dehydration. Serious health problems can occur if you do not maintain proper fluid levels. Treatment may include:  ¨ Taking an oral rehydration solution (ORS).  ¨ Not drinking beverages that contain caffeine (such as tea, coffee, and soft drinks).  ¨ Not drinking alcohol.  ¨ Maintaining well-balanced nutrition to help you recover faster.  HOME CARE INSTRUCTIONS   · Drink enough fluids to keep urine clear or pale yellow. Drink 1 cup (8 oz) of fluid for each diarrhea episode. Avoid fluids that contain simple sugars, fruit juices, whole milk products, and sodas. Hydrate with an ORS. You may purchase the ORS or prepare it at home by mixing the   following ingredients together:  ¨  - tsp (1.7-3  mL) table salt.  ¨ ¾ tsp (3 ¾ mL) baking soda.  ¨  tsp (1.7 mL) salt substitute containing potassium chloride.  ¨ 1 tbsp (20 mL) sugar.  ¨ 4.2 c (1 L) of water.    · Certain foods and beverages may increase the speed at which food moves through the gastrointestinal (GI) tract. These foods and beverages should be avoided. They include:  ¨ Caffeinated and alcoholic beverages.  ¨ High-fiber foods, such as raw fruits and vegetables, nuts, seeds, and whole grain breads and cereals.  ¨ Foods and beverages sweetened with sugar  alcohols, such as xylitol, sorbitol, and mannitol.    · Some foods may be well tolerated and may help thicken stool. These include:  ¨ Starchy foods, such as rice, toast, pasta, low-sugar cereal, oatmeal, grits, baked potatoes, crackers, and bagels.  ¨ Bananas.  ¨ Applesauce.  · Add probiotic-rich foods to help increase healthy bacteria in the GI tract. These include yogurt and fermented milk products.  · Wash your hands well after each diarrhea episode.  · Only take over-the-counter or prescription medicines as directed by your health care provider.  · Take a warm bath to relieve any burning or pain from frequent diarrhea episodes.  SEEK MEDICAL CARE IF:   · You are not urinating as often.  · Your urine is a dark color.  · You become very tired or dizzy.  · You have severe pain in the abdomen or rectum.  · Your have blood or pus in your stools.  · Your stools look black and tarry.  SEEK IMMEDIATE MEDICAL CARE IF:   · You are unable to keep fluids down.  · You have persistent vomiting.  · You have blood in your stool.  · Your stools are black and tarry.  · You do not urinate in 6-8 hours, or there is only a small amount of very dark urine.  · You have abdominal pain that increases or localizes.  · You have weakness, dizziness, confusion, or lightheadedness.  · You have a severe headache.  · Your diarrhea gets worse or does not get better.  · You have a fever or persistent symptoms for more than 2-3 days.  · You have a fever and your symptoms suddenly get worse.  MAKE SURE YOU:   · Understand these instructions.  · Will watch your condition.  · Will get help right away if you are not doing well or get worse.  Document Released: 09/01/2003 Document Revised: 06/16/2013 Document Reviewed: 12/04/2012  Exit[employer]® Patient Information ©2015 [employer]. This information is not intended to replace advice given to you by your health care provider. Make sure you discuss any questions you have with your health care  provider.

## 2014-12-24 NOTE — ED Notes (Signed)
Patient with no complaints at this time. Respirations even and unlabored. Skin warm/dry. Discharge instructions reviewed with patient at this time. Patient given opportunity to voice concerns/ask questions. IV removed per policy and band-aid applied to site. Patient discharged at this time and left Emergency Department with steady gait.  

## 2014-12-25 ENCOUNTER — Telehealth: Payer: Self-pay | Admitting: Emergency Medicine

## 2014-12-25 NOTE — ED Notes (Signed)
Pt called back and informed that a note would be passed a long to our nurse in charge of call backs who would be able to follow up with him on Monday.  Pt encouraged to come back if he starts to feel worse and we would be glad to take care of him.  Pt reports that he continues to have the same issues, but was thankful for the information

## 2014-12-26 LAB — GIARDIA/CRYPTOSPORIDIUM EIA
Cryptosporidium EIA: NEGATIVE
Giardia Ag, Stl: NEGATIVE

## 2014-12-27 LAB — STOOL CULTURE

## 2014-12-28 NOTE — ED Notes (Signed)
Called patient and gave results for stool tests and advised to follow with pcp.

## 2014-12-31 ENCOUNTER — Ambulatory Visit (INDEPENDENT_AMBULATORY_CARE_PROVIDER_SITE_OTHER): Payer: Self-pay | Admitting: Urology

## 2014-12-31 ENCOUNTER — Encounter: Payer: Self-pay | Admitting: *Deleted

## 2014-12-31 ENCOUNTER — Encounter: Payer: Self-pay | Admitting: Urology

## 2014-12-31 VITALS — BP 122/78 | HR 51 | Ht 66.0 in | Wt 178.8 lb

## 2014-12-31 DIAGNOSIS — N529 Male erectile dysfunction, unspecified: Secondary | ICD-10-CM | POA: Insufficient documentation

## 2014-12-31 DIAGNOSIS — F319 Bipolar disorder, unspecified: Secondary | ICD-10-CM | POA: Insufficient documentation

## 2014-12-31 DIAGNOSIS — K219 Gastro-esophageal reflux disease without esophagitis: Secondary | ICD-10-CM | POA: Insufficient documentation

## 2014-12-31 DIAGNOSIS — N50819 Testicular pain, unspecified: Secondary | ICD-10-CM

## 2014-12-31 DIAGNOSIS — E785 Hyperlipidemia, unspecified: Secondary | ICD-10-CM | POA: Insufficient documentation

## 2014-12-31 DIAGNOSIS — N509 Disorder of male genital organs, unspecified: Secondary | ICD-10-CM

## 2014-12-31 DIAGNOSIS — N411 Chronic prostatitis: Secondary | ICD-10-CM

## 2014-12-31 DIAGNOSIS — A609 Anogenital herpesviral infection, unspecified: Secondary | ICD-10-CM | POA: Insufficient documentation

## 2014-12-31 DIAGNOSIS — F410 Panic disorder [episodic paroxysmal anxiety] without agoraphobia: Secondary | ICD-10-CM | POA: Insufficient documentation

## 2014-12-31 MED ORDER — KETOROLAC TROMETHAMINE 10 MG PO TABS: 25.0000 mg | ORAL_TABLET | Freq: Four times a day (QID) | ORAL | Status: AC | PRN

## 2014-12-31 NOTE — Progress Notes (Signed)
12/31/2014 2:50 PM   Levora DredgeCarl F Rix Aug 10, 1965 [id number]  Referring provider: Cyndie MullGloria Patricia Santayana, DO [address] [place], [place] [id number]  Chief Complaint  Patient presents with  . Groin Swelling    er refferal Dr. Carollee MassedKaminski    HPI: Patient presents with a complex history. He seen 6 urologist to home and he was agreeable with. [facility]. This was Dr. Hanley HaysPaul Kulick. This evaluation consisted of nerve blocks epidural spinal block 0 block nerve denervation spermatic cord block did not take care of his orchialgia. He has occasional severe testicular pain that brings him to his knees. He is never been told he has prostatitis and never has been treated with low-dose chronic antibiotics the prostate is very painful to touch it is small in not nodular it is symmetric penis is normal as a secondary complaint of difficulty holding his erection I put back off to be part of his prostatitis problem not to his testicular problem. He is 49 years also holding erection can be difficult and his age. He does take Viagra did not ask for refill same. I refilled his ketorolac he had 25 mg by mouth when necessary severe pain and I've placed him on trimethoprim 100 mg daily he will follow-up with her gastroenterologist for his diarrhea and nausea. He may have a colitis with his pelvic pain. He has no bladder symptoms since his all pelvic floor pain     PMH: Past Medical History  Diagnosis Date  . Bipolar 1 disorder   . HIV (human immunodeficiency virus infection)   . Depression   . Anxiety   . Testicular pain   . Diarrhea   . CHF (congestive heart failure)     Surgical History: Past Surgical History  Procedure Laterality Date  . Shoulder surgery Right   . Cholecystectomy    . Tonsillectomy    . Repair / suture testicular injury Right   . Nerve blocks      several  . Surgery scrotal / testicular    .  Denervevation      Home Medications:    Medication List       This list is accurate as of: 12/31/14  2:50 PM.  Always use your most recent med list.               acetaminophen 500 MG tablet  Commonly known as:  TYLENOL  Take by mouth.     Adalimumab 40 MG/0.8ML Pskt  Inject 40 mg into the skin every 14 (fourteen) days.     buPROPion 150 MG 24 hr tablet  Commonly known as:  WELLBUTRIN XL  Take 150 mg by mouth daily.     ciprofloxacin 500 MG tablet  Commonly known as:  CIPRO  Take 1 tablet (500 mg total) by mouth once.     clonazePAM 0.5 MG tablet  Commonly known as:  KLONOPIN  Take 0.5 mg by mouth 3 (three) times daily as needed for anxiety.     emtricitabine-tenofovir 200-300 MG per tablet  Commonly known as:  TRUVADA  Take 1 tablet by mouth daily.     esomeprazole 40 MG capsule  Commonly known as:  NEXIUM  Take 40 mg by mouth daily at 12 noon.     folic acid 1 MG tablet  Commonly known as:  FOLVITE  Take 1 mg by mouth daily.     gabapentin  300 MG capsule  Commonly known as:  NEURONTIN  Take by mouth.     HYDROmorphone 2 MG tablet  Commonly known as:  DILAUDID  Take by mouth.     ibuprofen 200 MG tablet  Commonly known as:  ADVIL,MOTRIN  Take by mouth.     lithium 300 MG tablet  Take 900 mg by mouth 2 (two) times daily.     methotrexate 2.5 MG tablet  Commonly known as:  RHEUMATREX  Take 2.5 mg by mouth once a week. Caution:Chemotherapy. Protect from light.     metoCLOPramide 5 MG tablet  Commonly known as:  REGLAN  Take 1 tablet (5 mg total) by mouth 3 (three) times daily.     metroNIDAZOLE 500 MG tablet  Commonly known as:  FLAGYL  Take 1 tablet (500 mg total) by mouth 2 (two) times daily.     metroNIDAZOLE 500 MG tablet  Commonly known as:  FLAGYL  Take 1 tablet (500 mg total) by mouth once.     mirtazapine 15 MG tablet  Commonly known as:  REMERON  Sometimes     ondansetron 4 MG disintegrating tablet  Commonly known as:  ZOFRAN-ODT    Take by mouth.     ondansetron 4 MG tablet  Commonly known as:  ZOFRAN  Take by mouth.     oxyCODONE-acetaminophen 5-325 MG per tablet  Commonly known as:  ROXICET  Take 1 tablet by mouth every 4 (four) hours as needed for severe pain.     pantoprazole 40 MG tablet  Commonly known as:  PROTONIX  Take by mouth.     raltegravir 400 MG tablet  Commonly known as:  ISENTRESS  Take 400 mg by mouth 2 (two) times daily.     sildenafil 20 MG tablet  Commonly known as:  REVATIO  Take 10 mg by mouth daily as needed (used for testicle pain according to pt).     tamsulosin 0.4 MG Caps capsule  Commonly known as:  FLOMAX  Take 0.4 mg by mouth.     TORADOL ORAL PO  Take 25 mg by mouth daily. Pt states takes 25 mg of toradol for testicular pain     ketorolac 10 MG tablet  Commonly known as:  TORADOL  Take 2.5 tablets (25 mg total) by mouth every 6 (six) hours as needed.     traMADol 50 MG tablet  Commonly known as:  ULTRAM  Take by mouth.     valACYclovir 500 MG tablet  Commonly known as:  VALTREX  Take 500 mg by mouth 2 (two) times daily.        Allergies:  Allergies  Allergen Reactions  . Sulfamethoxazole-Trimethoprim Hives and Nausea And Vomiting  . Sulfa Antibiotics Rash and Hives    Family History: Family History  Problem Relation Age of Onset  . Kidney disease Neg Hx   . Bladder Cancer Neg Hx   . Prostate cancer Neg Hx   . Testicular cancer Neg Hx   . Arthritis Mother   . Heart disease Father     Social History:  reports that he has quit smoking. He has never used smokeless tobacco. He reports that he does not drink alcohol or use illicit drugs.  ROS: Urological Symptom Review  Patient is experiencing the following symptoms  :    Review of Systems UROLOGY Frequent Urination?: Yes Hard to postpone urination?: Yes Burning/pain with urination?: Yes Get up at night to urinate?: Yes Leakage of urine?: No Urine  stream starts and stops?: No Trouble  starting stream?: No Do you have to strain to urinate?: No Blood in urine?: No Urinary tract infection?: No Sexually transmitted disease?: Yes Injury to kidneys or bladder?: No Painful intercourse?: No Weak stream?: No Erection problems?: Yes Penile pain?: Yes Gastrointestinal Nausea?: Yes Vomiting?: Yes Indigestion/heartburn?: Yes Diarrhea?: No Constipation?: No Constitutional Fever: No Night sweats?: No Weight loss?: No Fatigue?: Yes Skin Skin rash/lesions?: No Itching?: No Eyes Blurred vision?: No Double vision?: No Ears/Nose/Throat Sore throat?: No Sinus problems?: No Hematologic/Lymphatic Swollen glands?: No Easy bruising?: No Cardiovascular Leg swelling?: No Chest pain?: No Respiratory Cough?: No Shortness of breath?: No Endocrine Excessive thirst?: No Musculoskeletal Back pain?: No Joint pain?: Yes Neurological Headaches?: Yes Dizziness?: No Psychologic Depression?: Yes Anxiety?: Yes   Physical Exam: BP 122/78 mmHg  Pulse 51  Ht 5\' 6"  (1.676 m)  Wt 178 lb 12.8 oz (81.103 kg)  BMI 28.87 kg/m2  Constitutional:  Alert and oriented, No acute distress. HEENT: [place] AT, moist mucus membranes.  Trachea midline, no masses. Cardiovascular: No clubbing, cyanosis, or edema. Respiratory: Normal respiratory effort, no increased work of breathing. GI: Abdomen is soft, nontender, nondistended, no abdominal masses GU: No CVA tenderness. Soft boggy prostate painful right testicle without epididymitis varicocelectomy or any real reason for pain. There is minimal hydrocele around the testicle testicle pain was reproduced on rectal exam Skin: No rashes, bruises or suspicious lesions. Lymph: No cervical or inguinal adenopathy. Neurologic: Grossly intact, no focal deficits, moving all 4 extremities. Psychiatric: Normal mood and affect.  Laboratory Data: Lab Results  Component Value Date   WBC 9.4 12/23/2014   HGB 15.4 12/23/2014   HCT 45.7 12/23/2014   MCV  99.9 12/23/2014   PLT 231 12/23/2014    Lab Results  Component Value Date   CREATININE 1.46* 12/23/2014    No results found for: PSA  No results found for: TESTOSTERONE  No results found for: HGBA1C  Urinalysis    Component Value Date/Time   COLORURINE YELLOW* 12/23/2014 2114   APPEARANCEUR CLEAR* 12/23/2014 2114   LABSPEC 1.017 12/23/2014 2114   PHURINE 6.0 12/23/2014 2114   GLUCOSEU NEGATIVE 12/23/2014 2114   HGBUR NEGATIVE 12/23/2014 2114   BILIRUBINUR NEGATIVE 12/23/2014 2114   KETONESUR NEGATIVE 12/23/2014 2114   PROTEINUR NEGATIVE 12/23/2014 2114   NITRITE NEGATIVE 12/23/2014 2114   LEUKOCYTESUR NEGATIVE 12/23/2014 2114    Pertinent Imaging: Pertinent imaging none Assessment & Plan:  This a patient whose rectal exam and a soft boggy painful prostate reproduced his testicular pain. He has a combination chronic prostatitis and orchialgia probably secondary to chronic prostatitis with some pelvic floor disorder this never been taken care of by pelvic floor rehabilitation. My suggestion to him continue him on his ketorolac by mouth present formation which helps his pain has intermittent testicular pain and long-term low-dose antibiotics. He is allergic to sulfa so I'll put him on trimethoprim or doxycycline for his chronic pain. A dressing is HIV positive and is openly K I would put him on a antibiotic for 2 months and then check his PSA to make sure there is no or prostate. Occasionally can present this way. I recommended pelvic floor rehabilitation at 1 ensure urology by urology pelvic floor physical therapist also recommended acupuncture. He also should see a.m. gastroenterologist for his diarrhea on occasion explosive in nature.  1. Chronic prostatitis - ketorolac (TORADOL) 10 MG tablet; Take 2.5 tablets (25 mg total) by mouth every 6 (six) hours as needed.  Dispense: 60 tablet; Refill: 0  2. Orchalgia    No Follow-up on file.  Lorraine Lax, MD  [facility]  [facility] [address] [place], [place] [address] [phone]

## 2015-01-03 NOTE — Telephone Encounter (Signed)
Spoke with pt in reference to obtaining colonoscopy. Pt stated Dr. Edwyna ShellHart was very adamant about getting colonoscopy results therefore Dr. Edwyna ShellHart should order it. Nurse explained to pt that we are a urology office which is a speciality and Dr. Edwyna ShellHart did not put in his office note that he wanted the colonoscopy ordered, therefore nurse could not order colonoscopy. Pt was very upset stating he has been having testicular pain for the past 3 years, has seen 6 different urologist, and from his understanding Dr. Edwyna ShellHart does not think his pain is urologically related. Nurse reinforced with pt that very well may be the case, but since Dr. Edwyna ShellHart is not here we can only go off of what his office visit note says. Nurse again reinforced to the pt per Dr. Scheryl DarterHart's note a colonoscopy can not be ordered. At this point pt's phone cut off and pt did not answer when nurse attempted to call pt back. Cw, lpn

## 2015-01-03 NOTE — Telephone Encounter (Signed)
Patient is calling and requesting a return call from the nursing staff regarding his appointment with Dr. Edwyna ShellHart.  Dr. Edwyna ShellHart recommended that he speak to his PCP about a referral to another doctor and he is not sure of his next steps.

## 2015-01-11 ENCOUNTER — Other Ambulatory Visit: Payer: Self-pay | Admitting: Physician Assistant

## 2015-01-11 DIAGNOSIS — K529 Noninfective gastroenteritis and colitis, unspecified: Secondary | ICD-10-CM

## 2015-01-11 DIAGNOSIS — R1030 Lower abdominal pain, unspecified: Secondary | ICD-10-CM

## 2015-01-12 ENCOUNTER — Ambulatory Visit: Payer: Medicare Other

## 2015-01-14 ENCOUNTER — Telehealth: Payer: Self-pay

## 2015-01-14 NOTE — Telephone Encounter (Signed)
Pt called with c/o not being able to get an erection since DRE at last office visit. Pt was demanding to speak to Dr. Edwyna Shell. Nurse advised pt Dr. Edwyna Shell is not here today and will not be back for 2 weeks. Pt became very angry and started yelling that is not acceptable. Nurse offered an appt to be made with another provider. Pt declined. Nurse made pt aware he could go to the ER. Pt declined demanding to see Dr. Edwyna Shell. Nurse again reinforced Dr. Edwyna Shell will not be in the office for 2 weeks. At which point pt demanded a referral to another urologist and demanded to be seen today. Nurse made pt aware a referral could be done, but could not promise he would be seen today. Pt started yelling again stating that was not acceptable and he needed to get an erection immediately. Nurse reinforced with pt an appt could be made with another provider or an appt could be made in 2 weeks with Dr. Edwyna Shell. Pt continued to yell and slammed the phone down.

## 2015-02-04 ENCOUNTER — Other Ambulatory Visit: Payer: Self-pay | Admitting: Unknown Physician Specialty

## 2015-02-04 DIAGNOSIS — M7541 Impingement syndrome of right shoulder: Secondary | ICD-10-CM

## 2015-02-04 DIAGNOSIS — M7521 Bicipital tendinitis, right shoulder: Secondary | ICD-10-CM

## 2015-02-10 ENCOUNTER — Ambulatory Visit: Payer: Medicare Other | Admitting: Anesthesiology

## 2015-02-10 ENCOUNTER — Ambulatory Visit: Payer: Medicare Other

## 2015-02-10 ENCOUNTER — Encounter: Payer: Self-pay | Admitting: *Deleted

## 2015-02-10 ENCOUNTER — Encounter
Admission: RE | Disposition: A | Discharge status: Home Health | Payer: Self-pay | Source: Ambulatory Visit | Attending: Unknown Physician Specialty

## 2015-02-10 ENCOUNTER — Ambulatory Visit
Admission: RE | Admit: 2015-02-10 | Discharge: 2015-02-10 | Disposition: A | Discharge status: Home Health | Payer: Medicare Other | Source: Ambulatory Visit | Attending: Unknown Physician Specialty | Admitting: Unknown Physician Specialty

## 2015-02-10 DIAGNOSIS — B20 Human immunodeficiency virus [HIV] disease: Secondary | ICD-10-CM | POA: Insufficient documentation

## 2015-02-10 DIAGNOSIS — Z79899 Other long term (current) drug therapy: Secondary | ICD-10-CM | POA: Insufficient documentation

## 2015-02-10 DIAGNOSIS — Z882 Allergy status to sulfonamides status: Secondary | ICD-10-CM | POA: Insufficient documentation

## 2015-02-10 DIAGNOSIS — Z87891 Personal history of nicotine dependence: Secondary | ICD-10-CM | POA: Diagnosis not present

## 2015-02-10 DIAGNOSIS — K648 Other hemorrhoids: Secondary | ICD-10-CM | POA: Insufficient documentation

## 2015-02-10 DIAGNOSIS — I252 Old myocardial infarction: Secondary | ICD-10-CM | POA: Insufficient documentation

## 2015-02-10 DIAGNOSIS — I509 Heart failure, unspecified: Secondary | ICD-10-CM | POA: Insufficient documentation

## 2015-02-10 DIAGNOSIS — R197 Diarrhea, unspecified: Secondary | ICD-10-CM | POA: Diagnosis present

## 2015-02-10 DIAGNOSIS — K219 Gastro-esophageal reflux disease without esophagitis: Secondary | ICD-10-CM | POA: Diagnosis not present

## 2015-02-10 DIAGNOSIS — E785 Hyperlipidemia, unspecified: Secondary | ICD-10-CM | POA: Diagnosis not present

## 2015-02-10 DIAGNOSIS — K529 Noninfective gastroenteritis and colitis, unspecified: Secondary | ICD-10-CM | POA: Diagnosis not present

## 2015-02-10 DIAGNOSIS — K2950 Unspecified chronic gastritis without bleeding: Secondary | ICD-10-CM | POA: Diagnosis not present

## 2015-02-10 DIAGNOSIS — R599 Enlarged lymph nodes, unspecified: Secondary | ICD-10-CM | POA: Diagnosis not present

## 2015-02-10 HISTORY — DX: Gastro-esophageal reflux disease without esophagitis: K21.9

## 2015-02-10 HISTORY — PX: COLONOSCOPY WITH PROPOFOL: SHX5780

## 2015-02-10 HISTORY — PX: ESOPHAGOGASTRODUODENOSCOPY (EGD) WITH PROPOFOL: SHX5813

## 2015-02-10 SURGERY — COLONOSCOPY WITH PROPOFOL
Anesthesia: General

## 2015-02-10 MED ORDER — GLYCOPYRROLATE 0.2 MG/ML IJ SOLN
INTRAMUSCULAR | Status: DC | PRN
  Administered 2015-02-10: 0.2 mg via INTRAVENOUS

## 2015-02-10 MED ORDER — PROPOFOL 10 MG/ML IV BOLUS
INTRAVENOUS | Status: DC | PRN
  Administered 2015-02-10: 50 mg via INTRAVENOUS

## 2015-02-10 MED ORDER — PROPOFOL INFUSION 10 MG/ML OPTIME
INTRAVENOUS | Status: DC | PRN
  Administered 2015-02-10: 200 ug/kg/min via INTRAVENOUS

## 2015-02-10 MED ORDER — LIDOCAINE HCL (PF) 2 % IJ SOLN
INTRAMUSCULAR | Status: DC | PRN
  Administered 2015-02-10: 50 mg

## 2015-02-10 MED ORDER — SODIUM CHLORIDE 0.9 % IV SOLN: INTRAVENOUS | Status: DC

## 2015-02-10 MED ORDER — FENTANYL CITRATE (PF) 100 MCG/2ML IJ SOLN
INTRAMUSCULAR | Status: DC | PRN
  Administered 2015-02-10: 50 ug via INTRAVENOUS

## 2015-02-10 MED ORDER — SODIUM CHLORIDE 0.9 % IV SOLN
INTRAVENOUS | Status: DC
  Administered 2015-02-10 (×2): via INTRAVENOUS

## 2015-02-10 MED ORDER — MIDAZOLAM HCL 5 MG/5ML IJ SOLN
INTRAMUSCULAR | Status: DC | PRN
  Administered 2015-02-10: 2 mg via INTRAVENOUS

## 2015-02-10 NOTE — Transfer of Care (Signed)
Immediate Anesthesia Transfer of Care Note  Patient: Dennis Thompson  Procedure(s) Performed: Procedure(s): COLONOSCOPY WITH PROPOFOL (N/A) ESOPHAGOGASTRODUODENOSCOPY (EGD) WITH PROPOFOL (N/A)  Patient Location: PACU  Anesthesia Type:General  Level of Consciousness: sedated  Airway & Oxygen Therapy: Patient Spontanous Breathing and Patient connected to nasal cannula oxygen  Post-op Assessment: Post -op Vital signs reviewed and stable  Post vital signs: Reviewed and stable  Last Vitals:  Filed Vitals:   02/10/15 1253  BP: 129/80  Pulse: 50  Temp: 37.3 C  Resp: 24    Complications: No apparent anesthesia complications

## 2015-02-10 NOTE — Op Note (Signed)
[facility] Patient Name: Dennis Thompson Procedure Date: 02/10/2015 1:31 PM MRN: [id number] Account #: [id number] Date of Birth: September 15, 1965 Admit Type: Outpatient Age: 49 Room: [facility] ENDO ROOM 1 Gender: Male Note Status: Finalized Procedure:         Upper GI endoscopy Indications:       Diarrhea Providers:         Scot Jun, MD Referring MD:      No Local Md, MD (Referring MD) Medicines:         Propofol per Anesthesia Complications:     No immediate complications. Procedure:         Pre-Anesthesia Assessment:                    - After reviewing the risks and benefits, the patient was                     deemed in satisfactory condition to undergo the procedure.                    After obtaining informed consent, the endoscope was passed                     under direct vision. Throughout the procedure, the                     patient's blood pressure, pulse, and oxygen saturations                     were monitored continuously. The Olympus GIF-160 endoscope                     (S#. [id number]) was introduced through the mouth, and                     advanced to the second part of duodenum. The upper GI                     endoscopy was accomplished without difficulty. The patient                     tolerated the procedure well. Findings:      The examined esophagus was normal.      The entire examined stomach was normal. Biopsies were taken with a cold       forceps for histology. Biopsies were taken with a cold forceps for       Helicobacter pylori testing.      The examined duodenum was normal. Biopsies were taken with a cold       forceps for histology. Biopsies for histology were taken with a cold       forceps for for evaluation of celiac disease. From both bulb and second       portion Impression:        - Normal esophagus.                    - Normal stomach. Biopsied.                    - Normal examined duodenum.  Biopsied. Recommendation:    - Await pathology results. Proceed with colonoscopy Scot Jun, MD 02/10/2015 1:56:59 PM This report has been signed electronically. Number of Addenda: 0 Note Initiated On: 02/10/2015 1:31 PM      [place]  [facility]

## 2015-02-10 NOTE — Op Note (Signed)
[facility]enterology Patient Name: Dennis Thompson Procedure Date: 02/10/2015 1:30 PM MRN: [id number] Account #: [id number] Date of Birth: Nov 01, 1965 Admit Type: Outpatient Age: 49 Room: [facility] ENDO ROOM 1 Gender: Male Note Status: Finalized Procedure:         Colonoscopy Indications:       Chronic diarrhea, Clinically significant diarrhea of                     unexplained origin Providers:         Scot Jun, MD Referring MD:      No Local Md, MD (Referring MD) Medicines:         Propofol per Anesthesia Complications:     No immediate complications. Procedure:         Pre-Anesthesia Assessment:                    - After reviewing the risks and benefits, the patient was                     deemed in satisfactory condition to undergo the procedure.                    After obtaining informed consent, the colonoscope was                     passed under direct vision. Throughout the procedure, the                     patient's blood pressure, pulse, and oxygen saturations                     were monitored continuously. The Olympus PCF-H180AL                     colonoscope ( S#: [id number] ) was introduced through the                     anus and advanced to the the cecum, identified by                     appendiceal orifice and ileocecal valve. The colonoscopy                     was performed without difficulty. The patient tolerated                     the procedure well. Findings:      Rectal exam showed small prostate without nodules.      The colon (entire examined portion) appeared normal. Biopsies were taken       with a cold forceps for histology. Done from ascending, transverse,       descending, and sigmoid colon.      Internal hemorrhoids were found. The hemorrhoids were small. Thin layer       of liquid stool was seen in many areas. No polyps seen.      The exam was otherwise without abnormality. Impression:        - The entire examined colon  is normal. Biopsied.                    - Internal hemorrhoids.                    - The examination was otherwise normal.  Recommendation:    - Await pathology results. Scot Jun, MD 02/10/2015 2:16:12 PM This report has been signed electronically. Number of Addenda: 0 Note Initiated On: 02/10/2015 1:30 PM Scope Withdrawal Time: 0 hours 7 minutes 37 seconds  Total Procedure Duration: 0 hours 10 minutes 31 seconds       [facility]

## 2015-02-10 NOTE — Anesthesia Preprocedure Evaluation (Signed)
Anesthesia Evaluation  Patient identified by MRN, date of birth, ID band Patient awake    Reviewed: Allergy & Precautions, H&P , NPO status , Patient's Chart, lab work & pertinent test results, reviewed documented beta blocker date and time   Airway Mallampati: II  TM Distance: >3 FB Neck ROM: full    Dental no notable dental hx. (+) Teeth Intact   Pulmonary neg pulmonary ROS, former smoker,  breath sounds clear to auscultation  Pulmonary exam normal       Cardiovascular Exercise Tolerance: Good + Past MI and +CHF Rhythm:regular Rate:Normal     Neuro/Psych PSYCHIATRIC DISORDERS negative neurological ROS  negative psych ROS   GI/Hepatic negative GI ROS, Neg liver ROS, GERD-  ,  Endo/Other  negative endocrine ROS  Renal/GU negative Renal ROS  negative genitourinary   Musculoskeletal   Abdominal   Peds  Hematology negative hematology ROS (+) HIV,   Anesthesia Other Findings   Reproductive/Obstetrics negative OB ROS                             Anesthesia Physical Anesthesia Plan  ASA: III  Anesthesia Plan: General   Post-op Pain Management:    Induction:   Airway Management Planned:   Additional Equipment:   Intra-op Plan:   Post-operative Plan:   Informed Consent: I have reviewed the patients History and Physical, chart, labs and discussed the procedure including the risks, benefits and alternatives for the proposed anesthesia with the patient or authorized representative who has indicated his/her understanding and acceptance.   Dental Advisory Given  Plan Discussed with: CRNA  Anesthesia Plan Comments:         Anesthesia Quick Evaluation

## 2015-02-11 ENCOUNTER — Encounter: Payer: Self-pay | Admitting: Unknown Physician Specialty

## 2015-02-14 ENCOUNTER — Ambulatory Visit
Admission: RE | Admit: 2015-02-14 | Discharge: 2015-02-14 | Disposition: A | Discharge status: Home / Self Care | Payer: Medicare Other | Source: Ambulatory Visit | Attending: Unknown Physician Specialty | Admitting: Unknown Physician Specialty

## 2015-02-14 DIAGNOSIS — M7521 Bicipital tendinitis, right shoulder: Secondary | ICD-10-CM

## 2015-02-14 DIAGNOSIS — M7541 Impingement syndrome of right shoulder: Secondary | ICD-10-CM | POA: Diagnosis not present

## 2015-02-14 NOTE — Anesthesia Postprocedure Evaluation (Signed)
  Anesthesia Post-op Note  Patient: Dennis Thompson  Procedure(s) Performed: Procedure(s): COLONOSCOPY WITH PROPOFOL (N/A) ESOPHAGOGASTRODUODENOSCOPY (EGD) WITH PROPOFOL (N/A)  Anesthesia type:General  Patient location: PACU  Post pain: Pain level controlled  Post assessment: Post-op Vital signs reviewed, Patient's Cardiovascular Status Stable, Respiratory Function Stable, Patent Airway and No signs of Nausea or vomiting  Post vital signs: Reviewed and stable  Last Vitals:  Filed Vitals:   02/10/15 1448  BP: 120/80  Pulse: 54  Temp:   Resp: 16    Level of consciousness: awake, alert  and patient cooperative  Complications: No apparent anesthesia complications

## 2015-02-15 LAB — SURGICAL PATHOLOGY

## 2015-03-04 ENCOUNTER — Ambulatory Visit: Payer: Self-pay | Admitting: Urology

## 2015-03-04 ENCOUNTER — Ambulatory Visit (INDEPENDENT_AMBULATORY_CARE_PROVIDER_SITE_OTHER): Payer: Medicare Other | Admitting: Obstetrics and Gynecology

## 2015-03-04 VITALS — BP 136/69 | HR 50 | Ht 66.0 in | Wt 174.7 lb

## 2015-03-04 DIAGNOSIS — N411 Chronic prostatitis: Secondary | ICD-10-CM

## 2015-03-04 DIAGNOSIS — N400 Enlarged prostate without lower urinary tract symptoms: Secondary | ICD-10-CM | POA: Diagnosis not present

## 2015-03-04 DIAGNOSIS — R102 Pelvic and perineal pain: Secondary | ICD-10-CM

## 2015-03-04 LAB — URINALYSIS, COMPLETE
BILIRUBIN UA: NEGATIVE
GLUCOSE, UA: NEGATIVE
KETONES UA: NEGATIVE
LEUKOCYTES UA: NEGATIVE
NITRITE UA: NEGATIVE
Protein, UA: NEGATIVE
RBC, UA: NEGATIVE
SPEC GRAV UA: 1.01 (ref 1.005–1.030)
Urobilinogen, Ur: 0.2 mg/dL (ref 0.2–1.0)
pH, UA: 7 (ref 5.0–7.5)

## 2015-03-04 LAB — MICROSCOPIC EXAMINATION
Bacteria, UA: NONE SEEN
Epithelial Cells (non renal): NONE SEEN /hpf (ref 0–10)
RBC, UA: NONE SEEN /hpf (ref 0–?)

## 2015-03-04 LAB — BLADDER SCAN AMB NON-IMAGING: Scan Result: 0

## 2015-03-04 MED ORDER — DIAZEPAM 5 MG PO TABS: 5.0000 mg | ORAL_TABLET | Freq: Every day | ORAL | Status: AC

## 2015-03-04 NOTE — Progress Notes (Signed)
03/04/2015 3:18 PM   Dennis Thompson 05/05/1966 [id number]  Referring provider: Cyndie Mull, DO [address] [place], [place] [address]  Chief Complaint  Patient presents with  . Prostatitis    HPI:  Dennis Thompson is a 49 year old male with a history of multiple psychiatric disorders, HIV and a recent history of chronic scrotal pain radiating to his penis for the last 3-4 years. He has been seen by at least 3 previous Urologists including Dr Marcello Fennel who performed a spermatic cord denervation, varicocelectomy, and epididymectomy 12/04/13- all of which were unsuccessful.  He has also been seen by pain management for nerve blocks without relief. Pharmacological management includes gabapentin, dilaudid and alternating ASA/acetaminophen. He states he has been offered right orchiectomy but has declined stating he is concerned about the appearance of his scrotum after surgery because he cannot afford a testicular prosthesis. He was most recently treated for chronic prostatitis by Dr. Edwyna Shell with one month of trimethoprim. Patient reports mild improvement in discomfort while he was taking antibiotics but it has since returned to baseline level of pain.  Negative cysto 11/19/13  It is of note the patient became became verbally aggressive when speaking to our staff over the telephone prior to this visit stating that he felt he was having difficulty with erections due to DRE performed by Dr. Edwyna Shell. According to available records patient has also a documented history being threatening towards staff at [facility] as well regarding medication refills on 07/09/14. Patient was cooperative during this visit though he did voices frustration regarding the fact that he feels that no one will listen to him or can find the source of his scrotal pain.    PMH: Past Medical History  Diagnosis Date  . Bipolar 1 disorder   . HIV (human immunodeficiency virus infection)   . Depression   . Anxiety   .  Testicular pain   . Diarrhea   . CHF (congestive heart failure)   . GERD (gastroesophageal reflux disease)     Surgical History: Past Surgical History  Procedure Laterality Date  . Shoulder surgery Right   . Cholecystectomy    . Tonsillectomy    . Repair / suture testicular injury Right   . Nerve blocks      several  . Surgery scrotal / testicular    . Denervevation    . Colonoscopy with propofol N/A 02/10/2015    Procedure: COLONOSCOPY WITH PROPOFOL;  Surgeon: Scot Jun, MD;  Location: [facility] ENDOSCOPY;  Service: Endoscopy;  Laterality: N/A;  . Esophagogastroduodenoscopy (egd) with propofol N/A 02/10/2015    Procedure: ESOPHAGOGASTRODUODENOSCOPY (EGD) WITH PROPOFOL;  Surgeon: Scot Jun, MD;  Location: [facility] ENDOSCOPY;  Service: Endoscopy;  Laterality: N/A;    Home Medications:    Medication List       This list is accurate as of: 03/04/15  3:18 PM.  Always use your most recent med list.               acetaminophen 500 MG tablet  Commonly known as:  TYLENOL  Take by mouth.     Adalimumab 40 MG/0.8ML Pskt  Inject 40 mg into the skin every 14 (fourteen) days.     buPROPion 150 MG 24 hr tablet  Commonly known as:  WELLBUTRIN XL  Take 150 mg by mouth daily.     ciprofloxacin 500 MG tablet  Commonly known as:  CIPRO  Take 1 tablet (500 mg total) by mouth once.  clonazePAM 0.5 MG tablet  Commonly known as:  KLONOPIN  Take 0.5 mg by mouth 3 (three) times daily as needed for anxiety.     diazepam 5 MG tablet  Commonly known as:  VALIUM  Take 1 tablet (5 mg total) by mouth daily.     emtricitabine-tenofovir 200-300 MG per tablet  Commonly known as:  TRUVADA  Take 1 tablet by mouth daily.     esomeprazole 40 MG capsule  Commonly known as:  NEXIUM  Take 40 mg by mouth daily at 12 noon.     folic acid 1 MG tablet  Commonly known as:  FOLVITE  Take 1 mg by mouth daily.     gabapentin 300 MG capsule  Commonly known as:  NEURONTIN  Take by mouth.       HYDROmorphone 2 MG tablet  Commonly known as:  DILAUDID  Take by mouth.     ibuprofen 200 MG tablet  Commonly known as:  ADVIL,MOTRIN  Take by mouth.     lithium 300 MG tablet  Take 900 mg by mouth 2 (two) times daily.     methotrexate 2.5 MG tablet  Commonly known as:  RHEUMATREX  Take 2.5 mg by mouth once a week. Caution:Chemotherapy. Protect from light.     metoCLOPramide 5 MG tablet  Commonly known as:  REGLAN  Take 1 tablet (5 mg total) by mouth 3 (three) times daily.     metroNIDAZOLE 500 MG tablet  Commonly known as:  FLAGYL  Take 1 tablet (500 mg total) by mouth 2 (two) times daily.     metroNIDAZOLE 500 MG tablet  Commonly known as:  FLAGYL  Take 1 tablet (500 mg total) by mouth once.     mirtazapine 15 MG tablet  Commonly known as:  REMERON  Sometimes     ondansetron 4 MG disintegrating tablet  Commonly known as:  ZOFRAN-ODT  Take by mouth.     ondansetron 4 MG tablet  Commonly known as:  ZOFRAN  Take by mouth.     oxyCODONE-acetaminophen 5-325 MG per tablet  Commonly known as:  ROXICET  Take 1 tablet by mouth every 4 (four) hours as needed for severe pain.     pantoprazole 40 MG tablet  Commonly known as:  PROTONIX  Take by mouth.     raltegravir 400 MG tablet  Commonly known as:  ISENTRESS  Take 400 mg by mouth 2 (two) times daily.     sildenafil 20 MG tablet  Commonly known as:  REVATIO  Take 10 mg by mouth daily as needed (used for testicle pain according to pt).     tamsulosin 0.4 MG Caps capsule  Commonly known as:  FLOMAX  Take 0.4 mg by mouth.     TORADOL ORAL PO  Take 25 mg by mouth daily. Pt states takes 25 mg of toradol for testicular pain     ketorolac 10 MG tablet  Commonly known as:  TORADOL  Take 2.5 tablets (25 mg total) by mouth every 6 (six) hours as needed.     traMADol 50 MG tablet  Commonly known as:  ULTRAM  Take by mouth.     valACYclovir 500 MG tablet  Commonly known as:  VALTREX  Take 500 mg by mouth 2 (two)  times daily.        Allergies:  Allergies  Allergen Reactions  . Sulfamethoxazole-Trimethoprim Hives and Nausea And Vomiting  . Sulfa Antibiotics Rash and Hives    Family History:  Family History  Problem Relation Age of Onset  . Kidney disease Neg Hx   . Bladder Cancer Neg Hx   . Prostate cancer Neg Hx   . Testicular cancer Neg Hx   . Arthritis Mother   . Heart disease Father     Social History:  reports that he has quit smoking. He has never used smokeless tobacco. He reports that he does not drink alcohol or use illicit drugs.  ROS: UROLOGY Frequent Urination?: Yes Hard to postpone urination?: No Burning/pain with urination?: Yes Get up at night to urinate?: Yes Leakage of urine?: No Urine stream starts and stops?: No Trouble starting stream?: No Do you have to strain to urinate?: No Blood in urine?: No Urinary tract infection?: No Sexually transmitted disease?: No Injury to kidneys or bladder?: No Painful intercourse?: No Weak stream?: No Erection problems?: Yes Penile pain?: Yes  Gastrointestinal Nausea?: No Vomiting?: No Indigestion/heartburn?: No Diarrhea?: Yes Constipation?: No  Constitutional Fever: No Night sweats?: No Weight loss?: No Fatigue?: No  Skin Skin rash/lesions?: No Itching?: No  Eyes Blurred vision?: No Double vision?: No  Ears/Nose/Throat Sore throat?: No Sinus problems?: No  Hematologic/Lymphatic Swollen glands?: No Easy bruising?: No  Cardiovascular Leg swelling?: No Chest pain?: No  Respiratory Cough?: No Shortness of breath?: No  Endocrine Excessive thirst?: No  Musculoskeletal Back pain?: No Joint pain?: No  Neurological Headaches?: Yes Dizziness?: No  Psychologic Depression?: Yes Anxiety?: Yes  Physical Exam: BP 136/69 mmHg  Pulse 50  Ht 5\' 6"  (1.676 m)  Wt 174 lb 11.2 oz (79.243 kg)  BMI 28.21 kg/m2  Constitutional:  Alert and oriented, No acute distress. HEENT: [place] AT, moist mucus  membranes.  Trachea midline, no masses. Cardiovascular: No clubbing, cyanosis, or edema. Respiratory: Normal respiratory effort, no increased work of breathing. Prostate: Tender but not boggy. Smooth without nodules Skin: No rashes, bruises or suspicious lesions. Lymph: No cervical or inguinal adenopathy. Neurologic: Grossly intact, no focal deficits, moving all 4 extremities. Psychiatric: Normal mood and affect.  Laboratory Data: Lab Results  Component Value Date   WBC 9.4 12/23/2014   HGB 15.4 12/23/2014   HCT 45.7 12/23/2014   MCV 99.9 12/23/2014   PLT 231 12/23/2014    Lab Results  Component Value Date   CREATININE 1.46* 12/23/2014    No results found for: PSA  No results found for: TESTOSTERONE  No results found for: HGBA1C  Urinalysis    Component Value Date/Time   COLORURINE YELLOW* 12/23/2014 2114   APPEARANCEUR CLEAR* 12/23/2014 2114   LABSPEC 1.017 12/23/2014 2114   PHURINE 6.0 12/23/2014 2114   GLUCOSEU NEGATIVE 12/23/2014 2114   HGBUR NEGATIVE 12/23/2014 2114   BILIRUBINUR NEGATIVE 12/23/2014 2114   KETONESUR NEGATIVE 12/23/2014 2114   PROTEINUR NEGATIVE 12/23/2014 2114   NITRITE NEGATIVE 12/23/2014 2114   LEUKOCYTESUR NEGATIVE 12/23/2014 2114    Pertinent Imaging: RAD US SCROTUM WITH LIMITED DOPPLER 08/25/2013 4:01 PM  SIGNS AND SYMPTOMS/COMMENTS: Right testicular pain  Comparison: None  Findings:   Static and cine ultrasound and Doppler ultrasound examinations of the  scrotum and testicles was performed.   The right testis appears normal measuring 5.4 x 2.6 x 3.3 cm and  exhibiting homogeneous echogenicity. There is normal color-flow as  well as arterial and venous spectral wave forms.   The left testis appears normal measuring 5.5 x 2.4 x 3.1 cm and  exhibiting homogeneous echogenicity. There is normal color-flow as  well as arterial and venous spectral wave forms.  The right epididymal head measures 0.7 x 1.0 x 0.8 cm. The left    epididymal head measures 0.6 x 1.2 x 0.8 cm. There is normal  color-flow seen. 2 small left epididymal head cysts are noted, the  largest measuring 8 x 7 x 8 mm.  Impression:   1. No evidence of testicular mass or torsion. 2. Small left epididymal head cysts. 3. Small bilateral hydroceles.  Assessment & Plan: 49 year old male with multiple psychiatric issues and a 4 year history of chronic scrotal pain status post treatment by 3 previous urologists  1. Prostatitis, chronic- Status post treatment with one month of trimethoprim prescribed by Dr. Edwyna Shell. Patient states that symptoms improved while he was taking the antibiotic but have since returned.  - Urinalysis, Complete-  2. Penile and Scrotal Pain- Will prescribe Valium  daily. Patient encouraged to try sitting and warm baths on a regular basis and when pain becomes more severe.  Follow-up with Dr. Ronne Binning in 1 month for results and recheck. Renal ultrasound and KUB ordered to rule out renal stones as nidus for scrotal and penile pain. Referral to [facility] for PT.  3. Prostate Cancer Screening-  DRE performed and PSA drawn today.  Return for with Dr. Ronne Binning in October  .  Earlie Lou, FNP  [facility] [address], [address] [place], [place] [address] [phone]

## 2015-03-04 NOTE — Progress Notes (Signed)
Bladder Scan °Patient  void: 0 ml °Performed By:K.Russell,CMA °

## 2015-03-05 LAB — PSA: Prostate Specific Ag, Serum: 0.4 ng/mL (ref 0.0–4.0)

## 2015-03-07 ENCOUNTER — Encounter: Payer: Self-pay | Admitting: Obstetrics and Gynecology

## 2015-03-07 ENCOUNTER — Telehealth: Payer: Self-pay

## 2015-03-07 NOTE — Telephone Encounter (Signed)
LMOM

## 2015-03-07 NOTE — Telephone Encounter (Signed)
-----   Message from Lindsay C Overton, FNP sent at 03/07/2015  3:30 PM EDT ----- Please notify patient that his PSA was normal at 0.4. Thank you 

## 2015-03-08 NOTE — Telephone Encounter (Signed)
LMOM

## 2015-03-08 NOTE — Telephone Encounter (Signed)
-----   Message from Lindsay C Overton, FNP sent at 03/07/2015  3:30 PM EDT ----- Please notify patient that his PSA was normal at 0.4. Thank you 

## 2015-03-10 ENCOUNTER — Other Ambulatory Visit: Payer: Self-pay | Admitting: Obstetrics and Gynecology

## 2015-03-10 DIAGNOSIS — R102 Pelvic and perineal pain: Secondary | ICD-10-CM

## 2015-03-10 NOTE — Telephone Encounter (Signed)
LMOM- will send pt a letter.  

## 2015-03-10 NOTE — Telephone Encounter (Signed)
-----   Message from Fernanda Drum, FNP sent at 03/07/2015  3:30 PM EDT ----- Please notify patient that his PSA was normal at 0.4. Thank you

## 2015-03-15 ENCOUNTER — Telehealth: Payer: Self-pay | Admitting: Obstetrics and Gynecology

## 2015-03-15 NOTE — Telephone Encounter (Signed)
What do they need to know?

## 2015-03-15 NOTE — Telephone Encounter (Signed)
I received a phone call from radiology requesting clarifications on a US renal ordered for tomorrow for pelvic/scrotum pain.

## 2015-03-16 ENCOUNTER — Ambulatory Visit
Admission: RE | Admit: 2015-03-16 | Discharge: 2015-03-16 | Disposition: A | Discharge status: Home / Self Care | Payer: Medicare Other | Source: Ambulatory Visit | Attending: Obstetrics and Gynecology | Admitting: Obstetrics and Gynecology

## 2015-03-16 DIAGNOSIS — R102 Pelvic and perineal pain: Secondary | ICD-10-CM | POA: Insufficient documentation

## 2015-03-18 ENCOUNTER — Telehealth: Payer: Self-pay

## 2015-03-18 NOTE — Telephone Encounter (Signed)
Spoke with pt in reference to rus and kub results. Pt voiced understanding.

## 2015-03-18 NOTE — Telephone Encounter (Signed)
-----   Message from Fernanda Drum, FNP sent at 03/17/2015  1:30 PM EDT ----- Please notify patient that both his renal ultrasound and KUB were both normal and negative for stones. Thanks

## 2015-03-22 NOTE — Telephone Encounter (Signed)
Please notify patient that both his renal ultrasound and KUB were both normal and negative for stones. Thanks

## 2015-03-28 ENCOUNTER — Ambulatory Visit: Payer: Self-pay | Admitting: Urology

## 2015-08-24 DEATH — deceased

## 2016-02-28 IMAGING — MR MR SHOULDER*R* W/CM
6 series · 40 of 40 positions shown · IV contrast (agent unspecified)
Comparison: None.

CLINICAL DATA: Surgery August 2014. Tightness in stabbing pain.
Limited range of motion.

EXAM:
MR ARTHROGRAM OF THE RIGHT SHOULDER
TECHNIQUE: Multiplanar, multisequence MR imaging of the right shoulder was
performed following the administration of intra-articular contrast.
CONTRAST:  See Injection Documentation.

[Series 3: T1 fat-sat · axial · 4.0mm · 0.47mm/px · z∈[-36,+69]mm · 8 of 25 slices shown (1 of 4)]
[im 1/25]
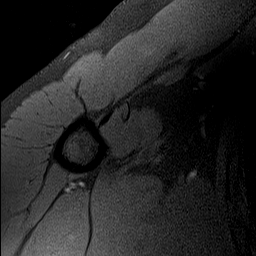
[im 4/25]
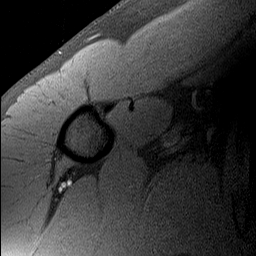
[im 7/25]
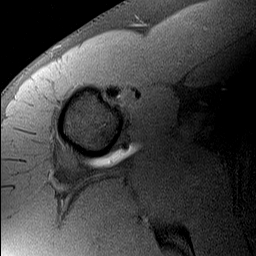
[im 11/25]
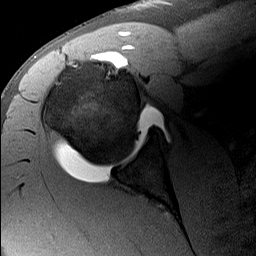
[im 14/25]
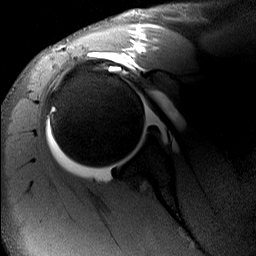
[im 18/25]
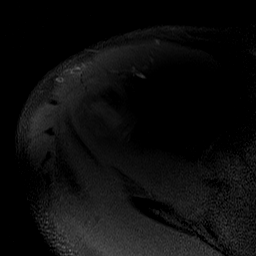
[im 21/25]
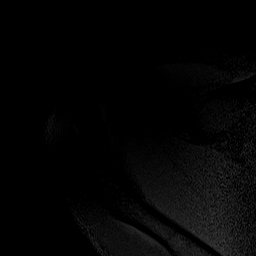
[im 25/25]
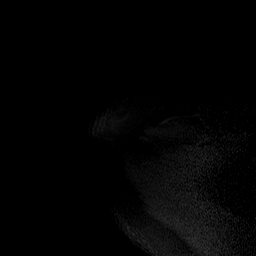

[Series 4: T1 fat-sat · oblique · 4.0mm · 0.62mm/px · 6 of 21 slices shown (2 of 4)]
[im 1/21]
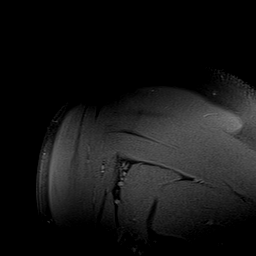
[im 5/21]
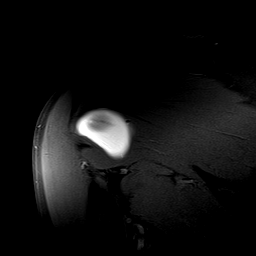
[im 9/21]
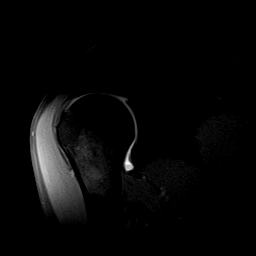
[im 13/21]
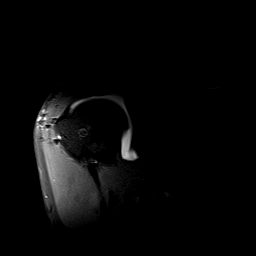
[im 17/21]
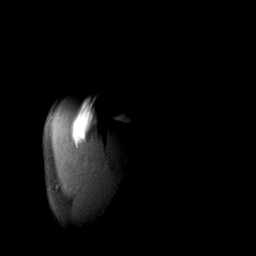
[im 21/21]
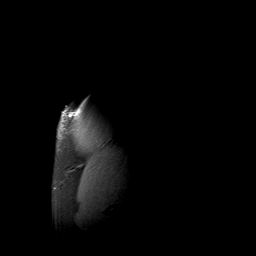

[Series 5: T2 fat-sat · oblique · 4.0mm · 0.62mm/px · 6 of 21 slices shown (1 of 2)]
[im 1/21]
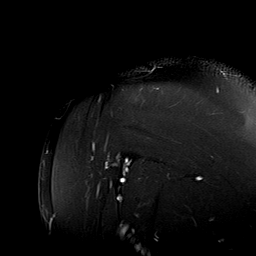
[im 5/21]
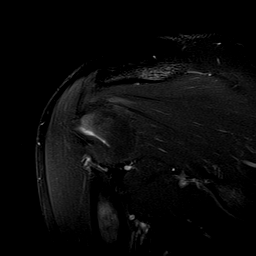
[im 9/21]
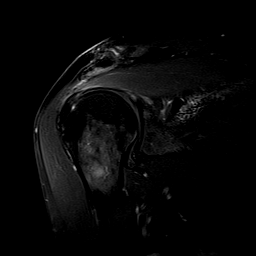
[im 13/21]
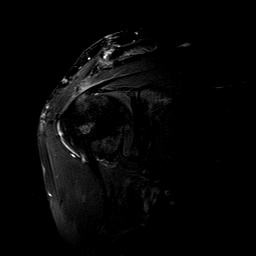
[im 17/21]
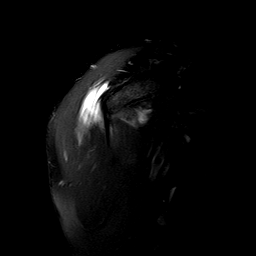
[im 21/21]
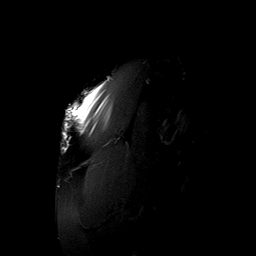

[Series 6: T1 fat-sat · oblique · non-contrast · 4.0mm · 0.42mm/px · 6 of 21 slices shown (3 of 4)]
[im 1/21]
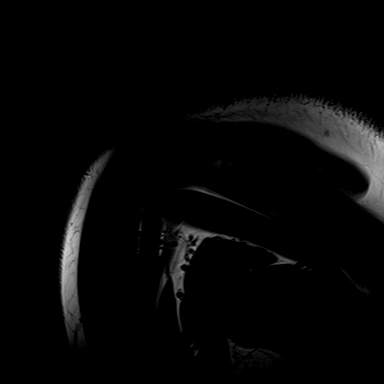
[im 5/21]
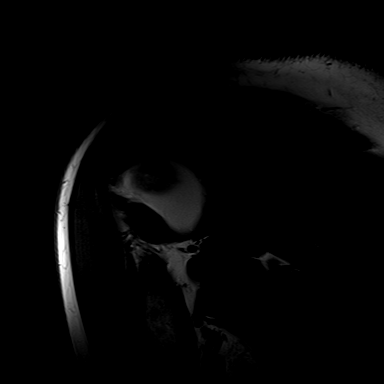
[im 9/21]
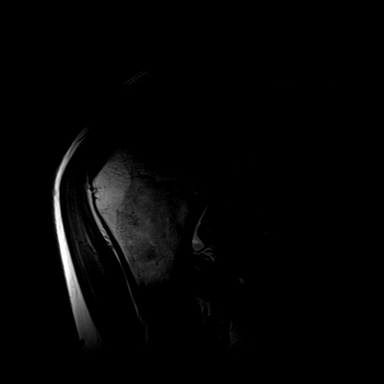
[im 13/21]
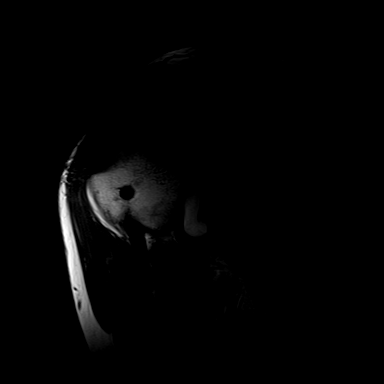
[im 17/21]
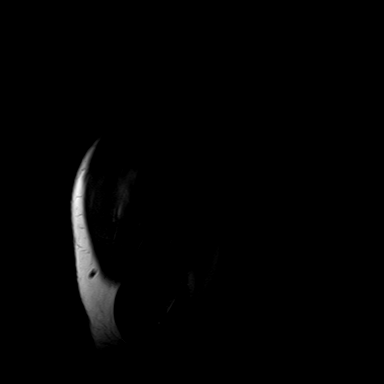
[im 21/21]
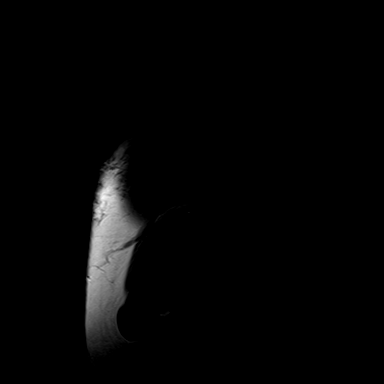

[Series 7: T2 fat-sat · oblique · 4.0mm · 0.62mm/px · 7 of 22 slices shown (2 of 2)]
[im 1/22]
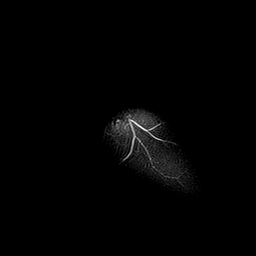
[im 4/22]
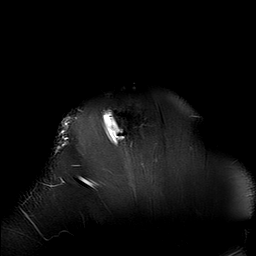
[im 8/22]
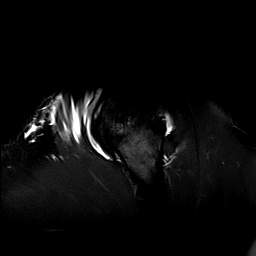
[im 11/22]
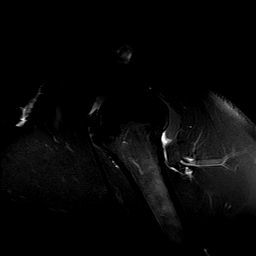
[im 15/22]
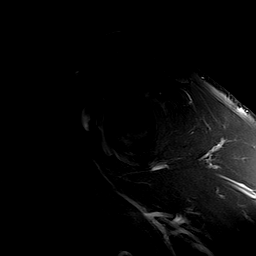
[im 18/22]
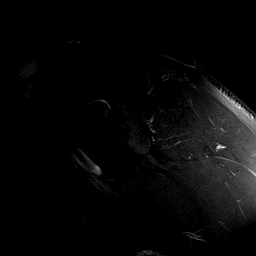
[im 22/22]
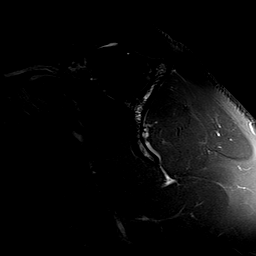

[Series 11: T1 fat-sat · oblique · 4.0mm · 0.62mm/px · 7 of 22 slices shown (4 of 4)]
[im 1/22]
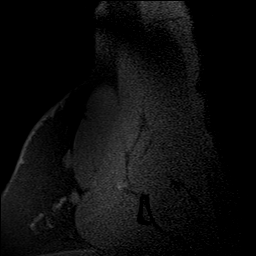
[im 4/22]
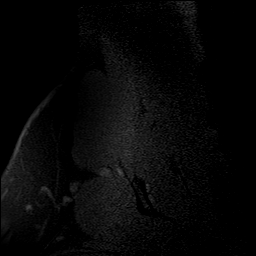
[im 8/22]
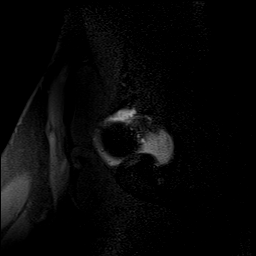
[im 11/22]
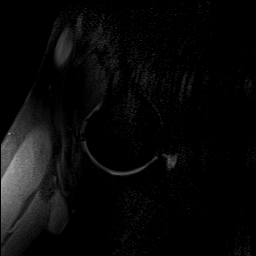
[im 15/22]
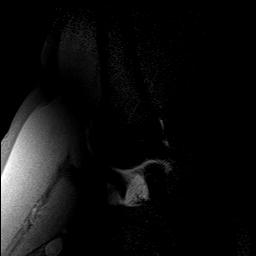
[im 18/22]
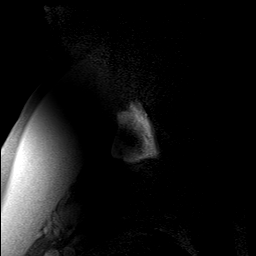
[im 22/22]
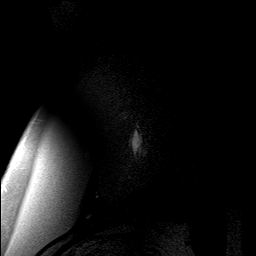

[40 of 40 positions shown; findings below may reference images not displayed]

FINDINGS: Rotator cuff: Supraspinatus tendon is intact. Minimal increased
signal within the supraspinatus tendon. Infraspinatus tendon is
intact. Teres minor tendon is intact. Subscapularis tendon is
intact.

Muscles: No atrophy or fatty replacement of nor abnormal signal
within, the muscles of the rotator cuff.

Biceps long head: Prior tenodesis.

Acromioclavicular Joint: Mild degenerative changes of the
acromioclavicular joint. Type I acromion. Small amount contrast in
the anterior subacromial/ subdeltoid bursa likely iatrogenic from
arthrogram contrast injection.

Glenohumeral Joint: Contrast within the joint space distends the
joint capsule. Normal glenohumeral ligaments. No chondral defect.

Labrum: Intact.

Bones: No marrow signal abnormality.  No fracture or dislocation.

Soft tissue: Small foci of susceptibility in the soft tissues around
the shoulder consistent with prior surgery.
IMPRESSION: 1. Prior rotator cuff repair with intact rotator cuff. Mild
increased signal within the supraspinatus tendon likely reflecting
postsurgical changes.
2. Prior tenodesis of the long head of the biceps tendon.
3. Normal distension of the glenohumeral joint capsule.

## 2016-03-29 IMAGING — CR DG ABDOMEN 1V
1 series · 2 of 2 positions shown · non-contrast
Comparison: 12/24/2014

CLINICAL DATA: Intermittent periumbilical abdominal pain. Groin
pain. History of renal calculi. Intermittent hematuria.

EXAM:
ABDOMEN - 1 VIEW

[Series 1: dg abd 1 view · 0.14mm/px · 2 of 2 slices shown]
[im 1/2]
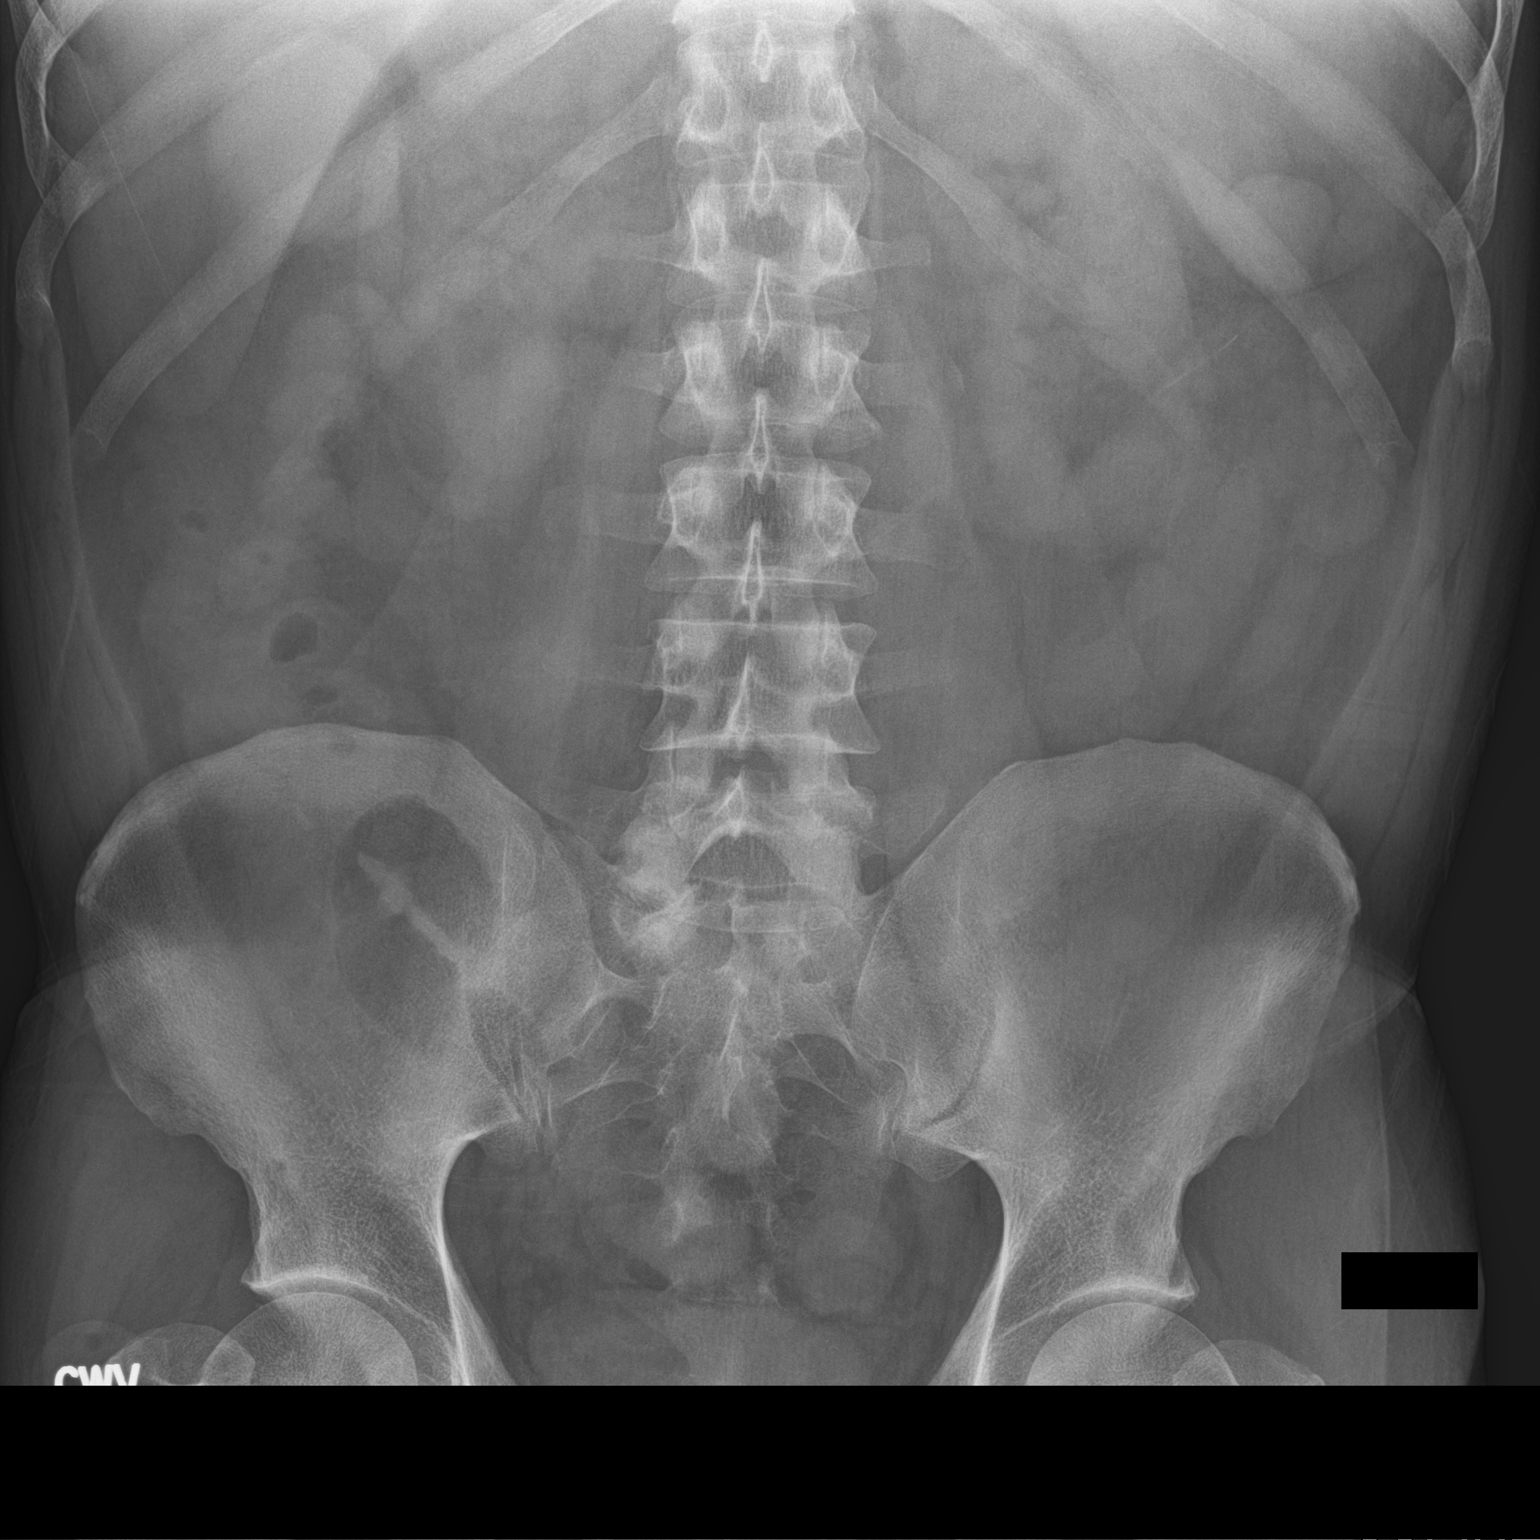
[im 2/2]
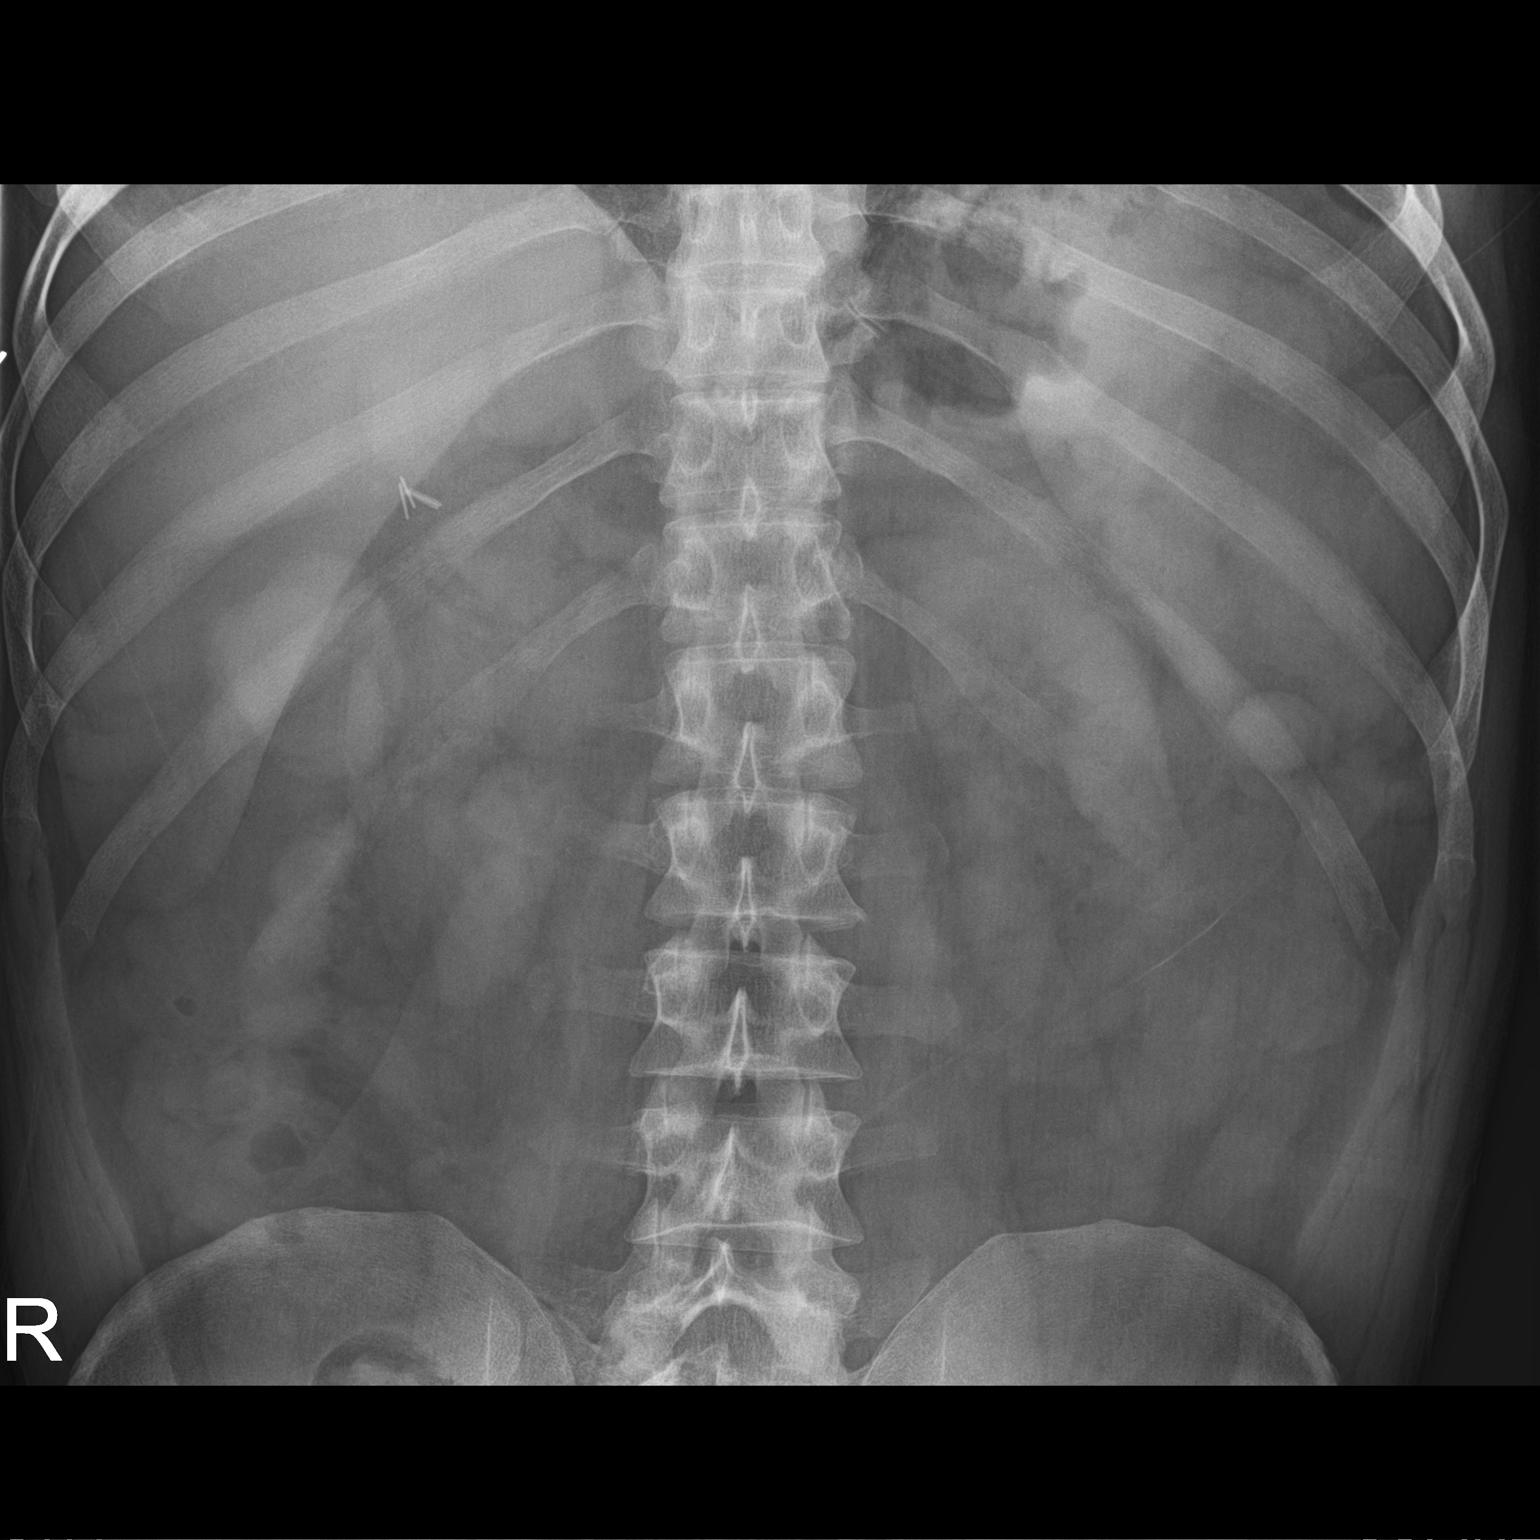

[2 of 2 positions shown; findings below may reference images not displayed]

FINDINGS: Normal renal shadow contour. No stones identified. No urinary
bladder distention observed. Bowel gas pattern normal.
IMPRESSION: 1.  No significant abnormality identified.

## 2017-08-21 IMAGING — US US RENAL
1 series · 14 of 25 positions shown · non-contrast
Comparison: CT scan abdomen dated 12/24/2014 and abdominal
radiographs dated 03/16/2015

CLINICAL DATA: Scrotal and pelvic pain. Intermittent gross
hematuria.

EXAM:
RENAL / URINARY TRACT ULTRASOUND COMPLETE

[Series 1: us renal · 0.26mm/px · 14 of 28 slices shown]
[im 1/28]
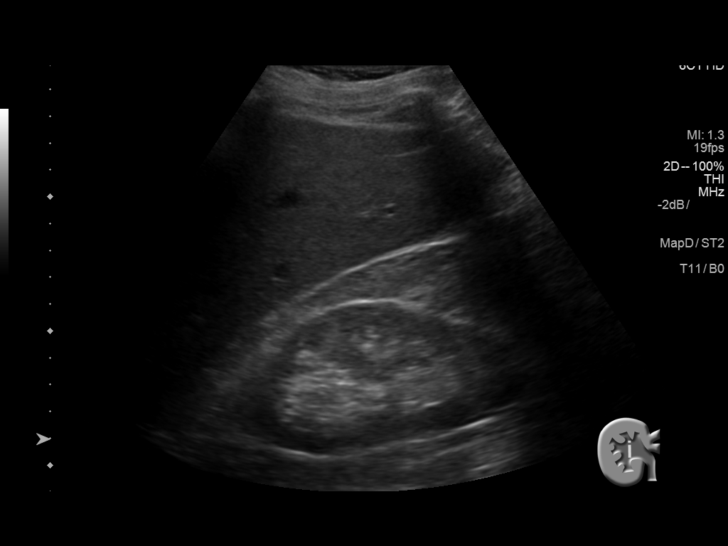
[im 3/28]
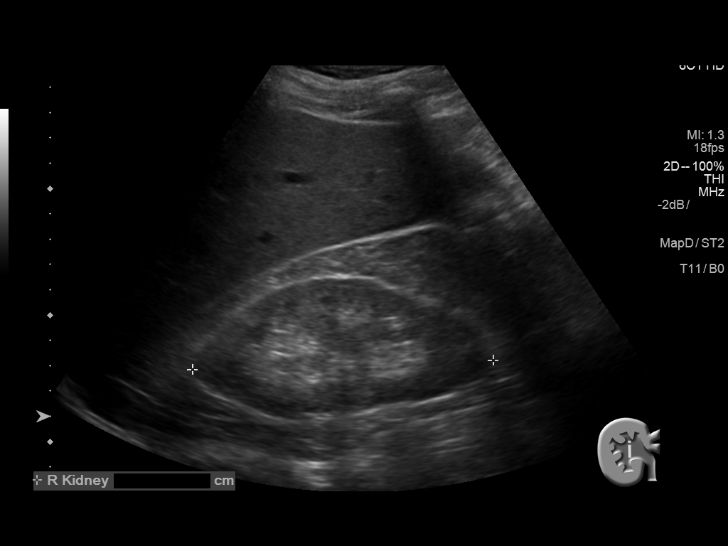
[im 5/28]
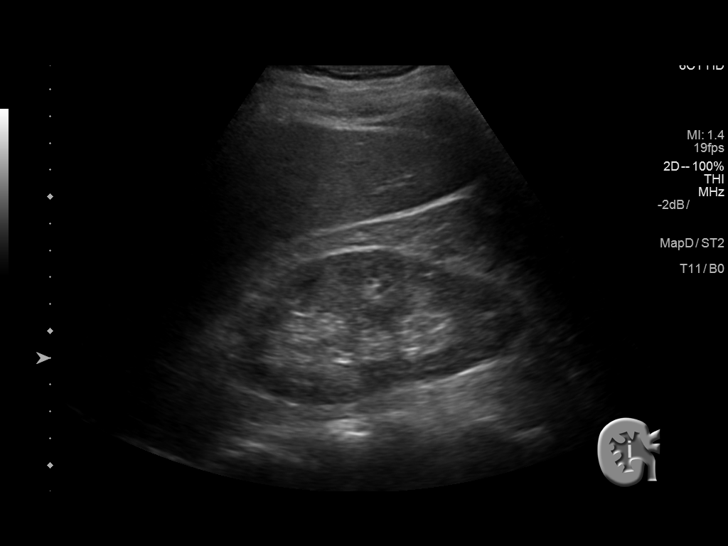
[im 7/28]
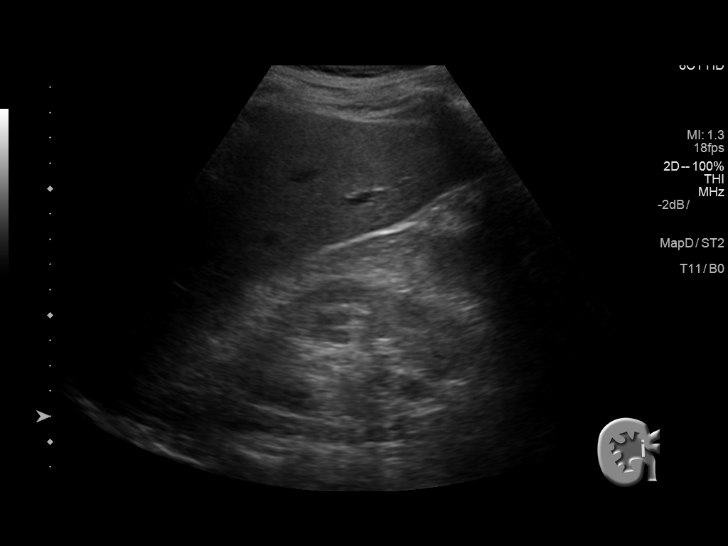
[im 10/28]
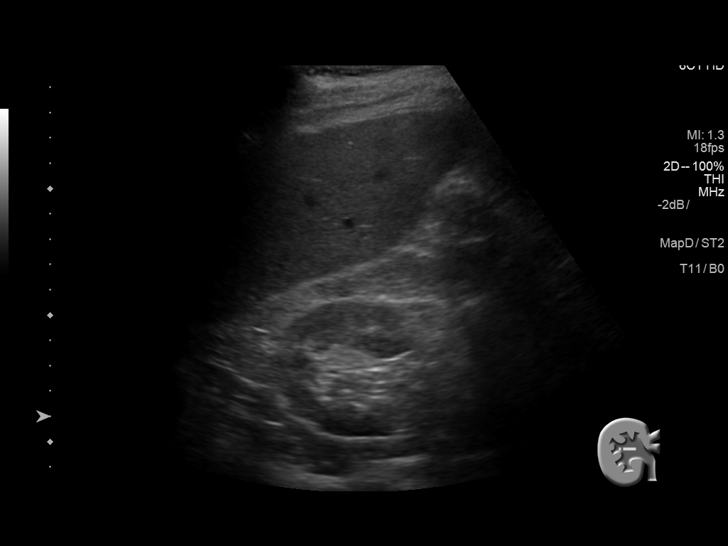
[im 11/28]
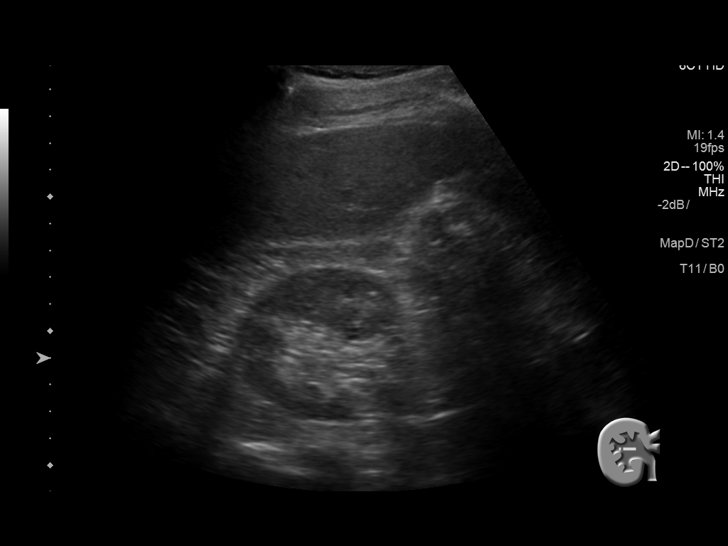
[im 13/28]
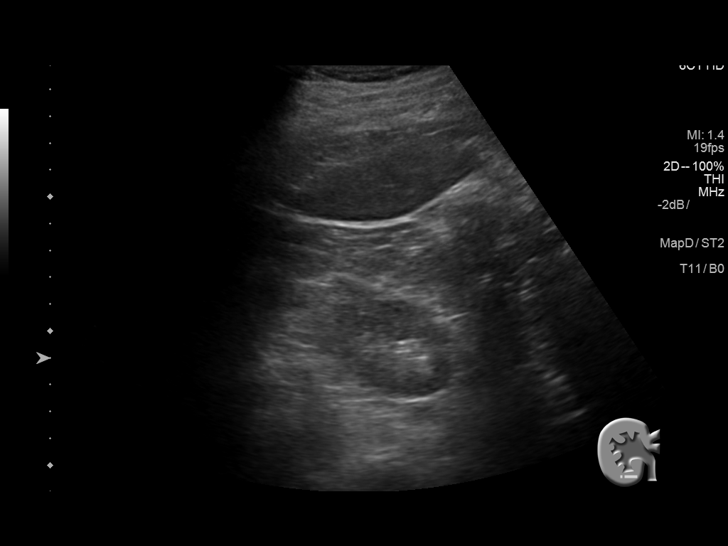
[im 15/28]
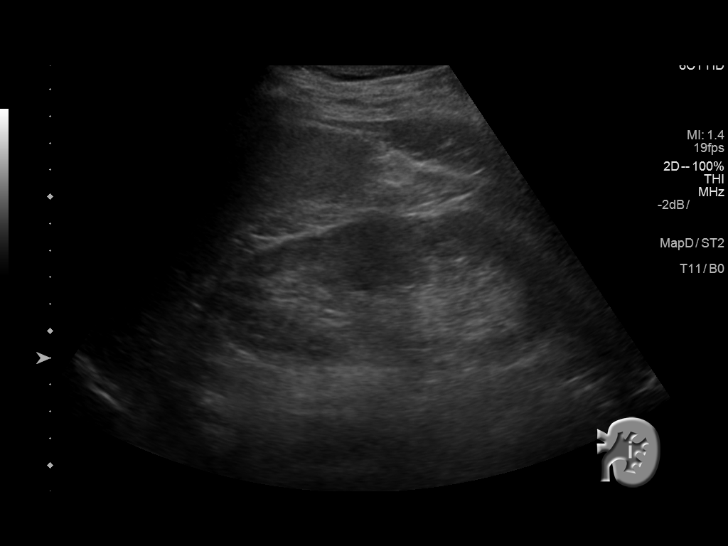
[im 17/28]
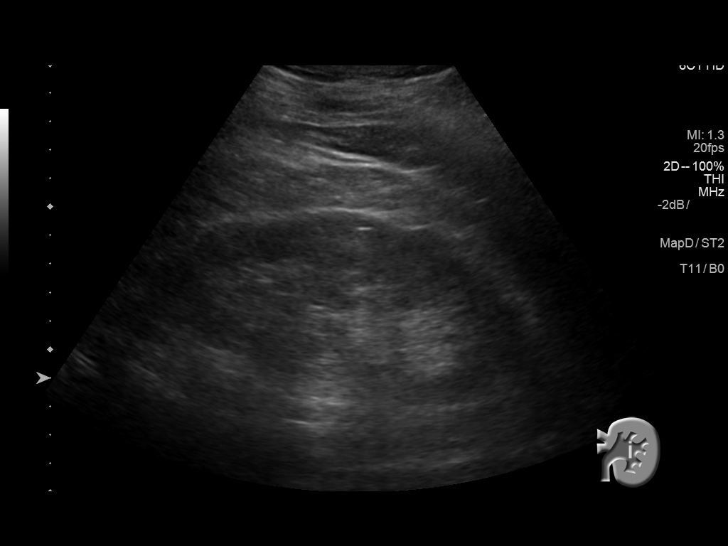
[im 19/28]
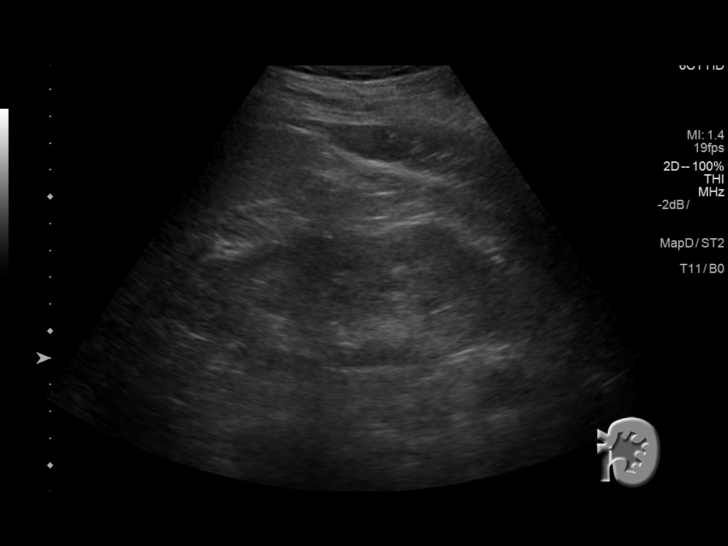
[im 21/28]
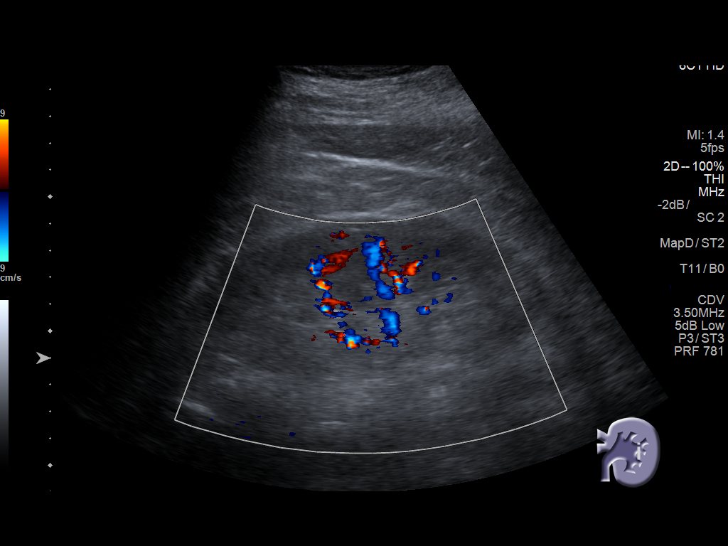
[im 23/28]
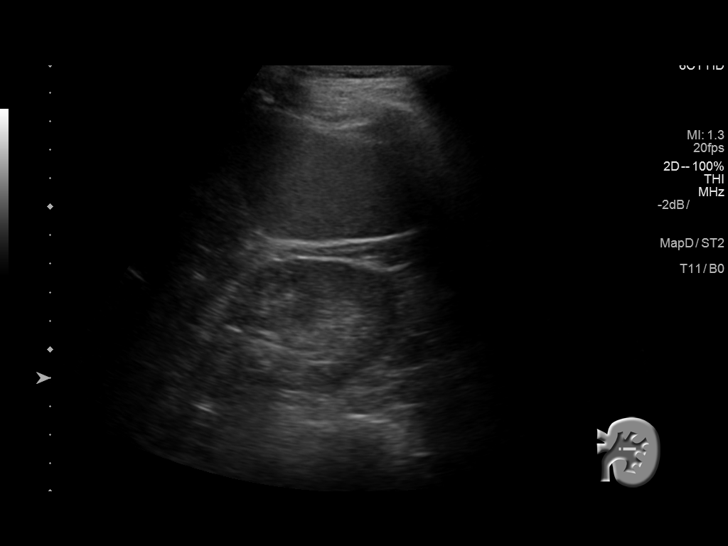
[im 25/28]
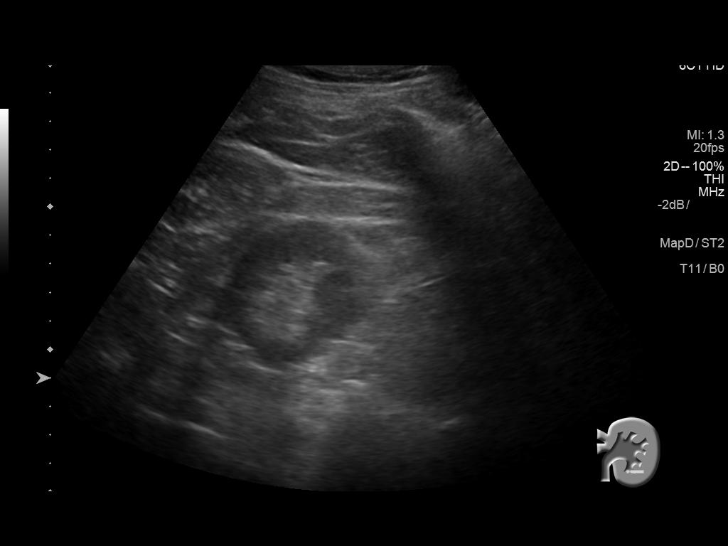
[im 28/28]
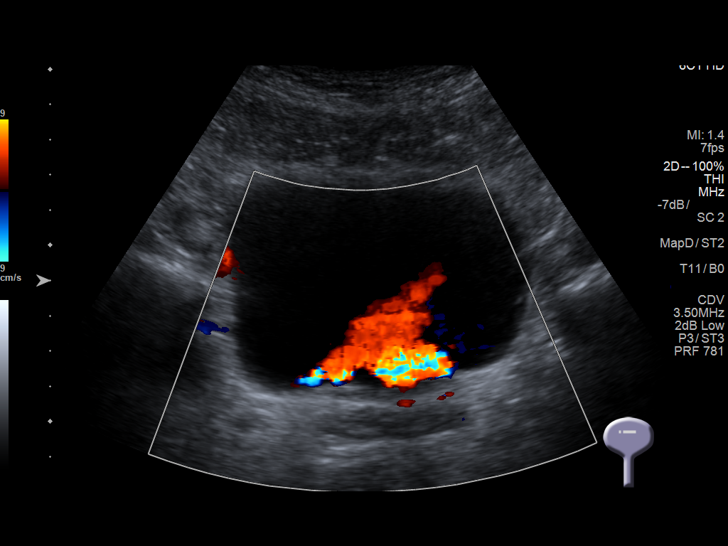

[14 of 25 positions shown; findings below may reference images not displayed]

FINDINGS: Right Kidney:

Length: 11.9 cm. Echogenicity within normal limits. No mass or
hydronephrosis visualized.

Left Kidney:

Length: 13.3 cm. Echogenicity within normal limits. No mass or
hydronephrosis visualized.

Bladder:

Appears normal for degree of bladder distention. Bilateral ureteral
jets are identified.
IMPRESSION: Normal exam.
